# Patient Record
Sex: Female | Born: 1985 | Race: White | Hispanic: No | Smoking: Former smoker
Health system: Southern US, Community
[De-identification: ages and names within clinical notes are randomized; demographics above are authoritative.]

## PROBLEM LIST (undated history)

## (undated) DIAGNOSIS — M51369 Other intervertebral disc degeneration, lumbar region without mention of lumbar back pain or lower extremity pain: Secondary | ICD-10-CM

## (undated) DIAGNOSIS — K219 Gastro-esophageal reflux disease without esophagitis: Secondary | ICD-10-CM

## (undated) DIAGNOSIS — R011 Cardiac murmur, unspecified: Secondary | ICD-10-CM

## (undated) DIAGNOSIS — T8859XA Other complications of anesthesia, initial encounter: Secondary | ICD-10-CM

## (undated) DIAGNOSIS — C539 Malignant neoplasm of cervix uteri, unspecified: Secondary | ICD-10-CM

## (undated) DIAGNOSIS — R112 Nausea with vomiting, unspecified: Secondary | ICD-10-CM

## (undated) DIAGNOSIS — F32A Depression, unspecified: Secondary | ICD-10-CM

## (undated) DIAGNOSIS — M5030 Other cervical disc degeneration, unspecified cervical region: Secondary | ICD-10-CM

## (undated) DIAGNOSIS — Z9889 Other specified postprocedural states: Secondary | ICD-10-CM

## (undated) DIAGNOSIS — I341 Nonrheumatic mitral (valve) prolapse: Secondary | ICD-10-CM

## (undated) DIAGNOSIS — J42 Unspecified chronic bronchitis: Secondary | ICD-10-CM

## (undated) DIAGNOSIS — M4722 Other spondylosis with radiculopathy, cervical region: Secondary | ICD-10-CM | POA: Insufficient documentation

## (undated) DIAGNOSIS — M5136 Other intervertebral disc degeneration, lumbar region: Secondary | ICD-10-CM

## (undated) DIAGNOSIS — D649 Anemia, unspecified: Secondary | ICD-10-CM

## (undated) DIAGNOSIS — F419 Anxiety disorder, unspecified: Secondary | ICD-10-CM

## (undated) HISTORY — PX: CERVICAL BIOPSY: SHX590

## (undated) HISTORY — PX: OTHER SURGICAL HISTORY: SHX169

## (undated) HISTORY — PX: BACK SURGERY: SHX140

## (undated) HISTORY — PX: APPENDECTOMY: SHX54

---

## 2005-01-31 ENCOUNTER — Emergency Department: Payer: Self-pay | Admitting: Emergency Medicine

## 2007-11-17 DIAGNOSIS — I341 Nonrheumatic mitral (valve) prolapse: Secondary | ICD-10-CM | POA: Insufficient documentation

## 2008-08-05 ENCOUNTER — Ambulatory Visit: Payer: Self-pay | Admitting: Internal Medicine

## 2008-08-15 ENCOUNTER — Emergency Department: Payer: Self-pay | Admitting: Emergency Medicine

## 2012-01-17 ENCOUNTER — Emergency Department: Payer: Self-pay | Admitting: Emergency Medicine

## 2012-01-17 LAB — URINALYSIS, COMPLETE
Bacteria: NONE SEEN
Bilirubin,UR: NEGATIVE
Glucose,UR: NEGATIVE mg/dL (ref 0–75)
Ketone: NEGATIVE
Nitrite: NEGATIVE
Ph: 5 (ref 4.5–8.0)
Protein: NEGATIVE
RBC,UR: 4 /HPF (ref 0–5)
Specific Gravity: 1.021 (ref 1.003–1.030)
Squamous Epithelial: 4
WBC UR: 48 /HPF (ref 0–5)

## 2012-01-17 LAB — WET PREP, GENITAL

## 2012-01-17 LAB — PREGNANCY, URINE: Pregnancy Test, Urine: NEGATIVE m[IU]/mL

## 2012-02-12 ENCOUNTER — Emergency Department: Payer: Self-pay | Admitting: Emergency Medicine

## 2013-06-11 ENCOUNTER — Emergency Department: Payer: Self-pay | Admitting: Emergency Medicine

## 2013-06-11 LAB — COMPREHENSIVE METABOLIC PANEL
Albumin: 4.1 g/dL (ref 3.4–5.0)
Alkaline Phosphatase: 48 U/L — ABNORMAL LOW (ref 50–136)
Anion Gap: 1 — ABNORMAL LOW (ref 7–16)
BUN: 15 mg/dL (ref 7–18)
Bilirubin,Total: 0.7 mg/dL (ref 0.2–1.0)
Calcium, Total: 8.9 mg/dL (ref 8.5–10.1)
Chloride: 104 mmol/L (ref 98–107)
Co2: 32 mmol/L (ref 21–32)
Creatinine: 0.72 mg/dL (ref 0.60–1.30)
EGFR (African American): >60
EGFR (Non-African Amer.): >60
Glucose: 110 mg/dL — ABNORMAL HIGH (ref 65–99)
Osmolality: 275 (ref 275–301)
Potassium: 3.9 mmol/L (ref 3.5–5.1)
SGOT(AST): 22 U/L (ref 15–37)
SGPT (ALT): 18 U/L (ref 12–78)
Sodium: 137 mmol/L (ref 136–145)
Total Protein: 7.8 g/dL (ref 6.4–8.2)

## 2013-06-11 LAB — CBC
HCT: 37.1 % (ref 35.0–47.0)
HGB: 13 g/dL (ref 12.0–16.0)
MCH: 31.5 pg (ref 26.0–34.0)
MCHC: 35 g/dL (ref 32.0–36.0)
MCV: 90 fL (ref 80–100)
Platelet: 293 10*3/uL (ref 150–440)
RBC: 4.11 10*6/uL (ref 3.80–5.20)
RDW: 12.4 % (ref 11.5–14.5)
WBC: 6.6 10*3/uL (ref 3.6–11.0)

## 2013-06-11 LAB — TROPONIN I: Troponin-I: <0.02 ng/mL

## 2013-08-16 ENCOUNTER — Ambulatory Visit: Payer: Self-pay | Admitting: Internal Medicine

## 2013-11-11 HISTORY — PX: CERVICAL BIOPSY: SHX590

## 2013-12-12 DIAGNOSIS — C539 Malignant neoplasm of cervix uteri, unspecified: Secondary | ICD-10-CM | POA: Insufficient documentation

## 2014-02-04 DIAGNOSIS — R0609 Other forms of dyspnea: Secondary | ICD-10-CM | POA: Insufficient documentation

## 2014-02-04 DIAGNOSIS — R0789 Other chest pain: Secondary | ICD-10-CM | POA: Insufficient documentation

## 2014-02-04 DIAGNOSIS — F419 Anxiety disorder, unspecified: Secondary | ICD-10-CM | POA: Insufficient documentation

## 2014-02-04 DIAGNOSIS — F410 Panic disorder [episodic paroxysmal anxiety] without agoraphobia: Secondary | ICD-10-CM | POA: Insufficient documentation

## 2018-11-11 HISTORY — PX: ANTERIOR CERVICAL DECOMP/DISCECTOMY FUSION: SHX1161

## 2020-04-11 HISTORY — PX: OTHER SURGICAL HISTORY: SHX169

## 2020-07-23 DIAGNOSIS — F32A Depression, unspecified: Secondary | ICD-10-CM | POA: Insufficient documentation

## 2020-08-11 ENCOUNTER — Encounter: Payer: Self-pay | Admitting: Intensive Care

## 2020-08-11 ENCOUNTER — Emergency Department
Admission: EM | Admit: 2020-08-11 | Discharge: 2020-08-11 | Disposition: A | Discharge status: Home / Self Care | Attending: Emergency Medicine | Admitting: Emergency Medicine

## 2020-08-11 ENCOUNTER — Emergency Department

## 2020-08-11 ENCOUNTER — Other Ambulatory Visit: Payer: Self-pay

## 2020-08-11 DIAGNOSIS — Z87891 Personal history of nicotine dependence: Secondary | ICD-10-CM | POA: Diagnosis not present

## 2020-08-11 DIAGNOSIS — R1013 Epigastric pain: Secondary | ICD-10-CM | POA: Insufficient documentation

## 2020-08-11 HISTORY — DX: Other cervical disc degeneration, unspecified cervical region: M50.30

## 2020-08-11 HISTORY — DX: Depression, unspecified: F32.A

## 2020-08-11 HISTORY — DX: Nonrheumatic mitral (valve) prolapse: I34.1

## 2020-08-11 HISTORY — DX: Anxiety disorder, unspecified: F41.9

## 2020-08-11 HISTORY — DX: Other intervertebral disc degeneration, lumbar region: M51.36

## 2020-08-11 HISTORY — DX: Other intervertebral disc degeneration, lumbar region without mention of lumbar back pain or lower extremity pain: M51.369

## 2020-08-11 LAB — CBC
HCT: 38.4 % (ref 36.0–46.0)
Hemoglobin: 12.1 g/dL (ref 12.0–15.0)
MCH: 27 pg (ref 26.0–34.0)
MCHC: 31.5 g/dL (ref 30.0–36.0)
MCV: 85.7 fL (ref 80.0–100.0)
Platelets: 341 10*3/uL (ref 150–400)
RBC: 4.48 MIL/uL (ref 3.87–5.11)
RDW: 15 % (ref 11.5–15.5)
WBC: 5.7 10*3/uL (ref 4.0–10.5)
nRBC: 0 % (ref 0.0–0.2)

## 2020-08-11 LAB — COMPREHENSIVE METABOLIC PANEL
ALT: 18 U/L (ref 0–44)
AST: 22 U/L (ref 15–41)
Albumin: 4.3 g/dL (ref 3.5–5.0)
Alkaline Phosphatase: 55 U/L (ref 38–126)
Anion gap: 8 (ref 5–15)
BUN: 7 mg/dL (ref 6–20)
CO2: 25 mmol/L (ref 22–32)
Calcium: 9.2 mg/dL (ref 8.9–10.3)
Chloride: 103 mmol/L (ref 98–111)
Creatinine, Ser: 0.84 mg/dL (ref 0.44–1.00)
GFR calc Af Amer: >60 mL/min (ref >60)
GFR calc non Af Amer: >60 mL/min (ref >60)
Glucose, Bld: 96 mg/dL (ref 70–99)
Potassium: 3.8 mmol/L (ref 3.5–5.1)
Sodium: 136 mmol/L (ref 135–145)
Total Bilirubin: 0.9 mg/dL (ref 0.3–1.2)
Total Protein: 7.8 g/dL (ref 6.5–8.1)

## 2020-08-11 LAB — URINALYSIS, COMPLETE (UACMP) WITH MICROSCOPIC
Bilirubin Urine: NEGATIVE
Glucose, UA: NEGATIVE mg/dL
Hgb urine dipstick: NEGATIVE
Ketones, ur: NEGATIVE mg/dL
Leukocytes,Ua: NEGATIVE
Nitrite: NEGATIVE
Protein, ur: NEGATIVE mg/dL
Specific Gravity, Urine: 1.012 (ref 1.005–1.030)
pH: 5 (ref 5.0–8.0)

## 2020-08-11 LAB — POCT PREGNANCY, URINE: Preg Test, Ur: NEGATIVE

## 2020-08-11 LAB — LIPASE, BLOOD: Lipase: 30 U/L (ref 11–51)

## 2020-08-11 MED ORDER — TRAMADOL HCL 50 MG PO TABS
50.0000 mg | ORAL_TABLET | Freq: Four times a day (QID) | ORAL | 0 refills | Status: AC | PRN
Start: 2020-08-11 — End: 2020-08-14

## 2020-08-11 MED ORDER — IOHEXOL 300 MG/ML  SOLN
100.0000 mL | Freq: Once | INTRAMUSCULAR | Status: AC | PRN
  Administered 2020-08-11: 100 mL via INTRAVENOUS
  Filled 2020-08-11: qty 100

## 2020-08-11 MED ORDER — TRAMADOL HCL 50 MG PO TABS
50.0000 mg | ORAL_TABLET | Freq: Four times a day (QID) | ORAL | 0 refills | Status: DC | PRN
Start: 2020-08-11 — End: 2020-08-11

## 2020-08-11 NOTE — ED Notes (Signed)
See triage note  Presents with some abd pain  States she had a tummy tuck couple of months ago  No fever  States pain is worsen

## 2020-08-11 NOTE — ED Triage Notes (Signed)
Patient presents with abdominal pain that has worsened today. June 30,2021 patient had a muscle repair and tummy tuck completed in [place]. Has since relocated here and her surgeon sent her to ER.

## 2020-08-11 NOTE — ED Provider Notes (Signed)
Emergency Department Provider Note  ____________________________________________  Time seen: Approximately 7:43 PM  I have reviewed the triage vital signs and the nursing notes.   HISTORY  Chief Complaint No chief complaint on file.   Historian Patient     HPI Amanda Harrington is a 34 y.o. female presents to the emergency department with epigastric abdominal pain.  Patient states that she had a tummy tuck several months ago in [place].  She states that she had some lower abdominal discomfort since coming to have occurred but lower abdominal pain is since improved.  Patient states over the past week, she has had worsening epigastric pain that does not change with eating or drinking.  She states that pain is relatively constant.  She states that she has had 1 day of tactile fever earlier this week.  No nausea or vomiting.  She denies chest pain, chest tightness or shortness of breath.  No other alleviating measures have been attempted.   Past Medical History:  Diagnosis Date  . Anxiety   . Degenerative disc disease, cervical   . Degenerative disc disease, lumbar   . Depression   . Mitral valve prolapse       Immunizations up to date:  Yes.     Past Medical History:  Diagnosis Date  . Anxiety   . Degenerative disc disease, cervical   . Degenerative disc disease, lumbar   . Depression   . Mitral valve prolapse     There are no problems to display for this patient.   History reviewed. No pertinent surgical history.  Prior to Admission medications   Medication Sig Start Date End Date Taking? Authorizing Provider  clonazePAM (KLONOPIN) 1 MG tablet Take 1 mg by mouth 2 (two) times daily as needed for anxiety.   Yes [provider]  metoprolol tartrate (LOPRESSOR) 50 MG tablet Take 50 mg by mouth 2 (two) times daily.   Yes [provider]  omeprazole (PRILOSEC) 40 MG capsule Take 40 mg by mouth daily.   Yes [provider]   venlafaxine (EFFEXOR) 100 MG tablet Take 200 mg by mouth daily.   Yes [provider]  traMADol (ULTRAM) 50 MG tablet Take 1 tablet (50 mg total) by mouth every 6 (six) hours as needed for up to 3 days. 08/11/20 08/14/20  [name], PA-C    Allergies Phenergan [promethazine hcl]  History reviewed. No pertinent family history.  Social History Social History   Tobacco Use  . Smoking status: Former Smoker    Types: Cigarettes  . Smokeless tobacco: Never Used  Vaping Use  . Vaping Use: Every day  Substance Use Topics  . Alcohol use: Yes    Comment: occ  . Drug use: Not Currently    Types: Marijuana     Review of Systems  Constitutional: No fever/chills Eyes:  No discharge ENT: No upper respiratory complaints. Respiratory: no cough. No SOB/ use of accessory muscles to breath Gastrointestinal: Patient has epigastric abdominal discomfort.  Musculoskeletal: Negative for musculoskeletal pain. Skin: Negative for rash, abrasions, lacerations, ecchymosis.    _________________________________________   PHYSICAL EXAM:  VITAL SIGNS: ED Triage Vitals  Enc Vitals Group     BP 08/11/20 1516 (!) 128/95     Pulse Rate 08/11/20 1516 80     Resp 08/11/20 1516 16     Temp 08/11/20 1516 98.8 F (37.1 C)     Temp Source 08/11/20 1516 Oral     SpO2 08/11/20 1516 99 %  Weight 08/11/20 1517 142 lb (64.4 kg)     Height 08/11/20 1517 5\' 2"  (1.575 m)     Head Circumference --      Peak Flow --      Pain Score 08/11/20 1516 8     Pain Loc --      Pain Edu? --      Excl. in [place]? --      Constitutional: Alert and oriented. Well appearing and in no acute distress. Eyes: Conjunctivae are normal. PERRL. EOMI. Head: Atraumatic. ENT:      Nose: No congestion/rhinnorhea.      Mouth/Throat: Mucous membranes are moist.  Neck: No stridor.  No cervical spine tenderness to palpation. Cardiovascular: Normal rate, regular rhythm. Normal S1 and S2.  Good peripheral  circulation. Respiratory: Normal respiratory effort without tachypnea or retractions. Lungs CTAB. Good air entry to the bases with no decreased or absent breath sounds Gastrointestinal: Tummy tuck scars visualized.  Bowel sounds x 4 quadrants. Soft and nontender to palpation. No guarding or rigidity. No distention. Musculoskeletal: Full range of motion to all extremities. No obvious deformities noted Neurologic:  Normal for age. No gross focal neurologic deficits are appreciated.  Skin:  Skin is warm, dry and intact. No rash noted. Psychiatric: Mood and affect are normal for age. Speech and behavior are normal.   ____________________________________________   LABS (all labs ordered are listed, but only abnormal results are displayed)  Labs Reviewed  URINALYSIS, COMPLETE (UACMP) WITH MICROSCOPIC - Abnormal; Notable for the following components:      Result Value   Color, Urine YELLOW (*)    APPearance HAZY (*)    Bacteria, UA MANY (*)    All other components within normal limits  LIPASE, BLOOD  COMPREHENSIVE METABOLIC PANEL  CBC  POCT PREGNANCY, URINE  POC URINE PREG, ED   ____________________________________________  EKG   ____________________________________________  RADIOLOGY Unk Pinto, personally viewed and evaluated these images (plain radiographs) as part of my medical decision making, as well as reviewing the written report by the radiologist.    CT ABDOMEN PELVIS W CONTRAST  Result Date: 08/11/2020 CLINICAL DATA:  Abdominal pain and fever. Patient reports muscle repair and tummy tuck May 10, 2020 at an outside institution. EXAM: CT ABDOMEN AND PELVIS WITH CONTRAST TECHNIQUE: Multidetector CT imaging of the abdomen and pelvis was performed using the standard protocol following bolus administration of intravenous contrast. CONTRAST:  122mL OMNIPAQUE IOHEXOL 300 MG/ML  SOLN COMPARISON:  None. FINDINGS: Lower chest: Lung bases are clear. Hepatobiliary: No focal  hepatic abnormality. Suspect intraluminal gallstones without abnormal gallbladder distention or pericholecystic inflammation. There is no biliary dilatation. Pancreas: No ductal dilatation or inflammation. Spleen: Normal in size without focal abnormality. Adrenals/Urinary Tract: Normal adrenal glands. No hydronephrosis or perinephric edema. Homogeneous renal enhancement. Urinary bladder is physiologically distended without wall thickening. Stomach/Bowel: Nondistended stomach. Normal positioning of the duodenum and ligament of Treitz. There is no bowel obstruction or inflammation. Lipomatous hypertrophy of the ileocecal valve. Appendix is not seen, there are surgical clips adjacent to the cecum suggesting prior appendectomy per liquid stool in the proximal most colon. Formed stool in the transverse colon. Occasional diverticula involving the descending colon. No colonic inflammation or pericolonic edema. Vascular/Lymphatic: Normal caliber abdominal aorta. Patent portal vein. No enlarged abdominopelvic lymph nodes. Few prominent bilateral inguinal nodes are likely reactive. Reproductive: Anteverted uterus which is unremarkable. Symmetric appearing ovaries with uniformly sized follicles. No suspicious adnexal mass. Trace free fluid in the pelvis. Other:  Trace free fluid in the pelvis, no other free fluid or ascites. There is no free air. Postsurgical change of the lower anterior abdominal wall with stranding. No discrete focal fluid collection. No soft tissue air. Postsurgical change at the umbilicus. Musculoskeletal: There are no acute or suspicious osseous abnormalities. No intramuscular fluid collection. IMPRESSION: 1. Postsurgical change of the lower anterior abdominal wall with stranding. No discrete focal fluid collection or soft tissue air. This may represent postsurgical scarring or infection. 2. Suspect intraluminal gallstones without gallbladder inflammation. 3. Minimal colonic diverticulosis without  diverticulitis. Electronically Signed   By: Keith Rake M.D.   On: 08/11/2020 20:21    ____________________________________________    PROCEDURES  Procedure(s) performed:     Procedures     Medications  iohexol (OMNIPAQUE) 300 MG/ML solution 100 mL (100 mLs Intravenous Contrast Given 08/11/20 2004)     ____________________________________________   INITIAL IMPRESSION / ASSESSMENT AND PLAN / ED COURSE  Pertinent labs & imaging results that were available during my care of the patient were reviewed by me and considered in my medical decision making (see chart for details).  Clinical Course as of Aug 11 2110  Fri Aug 11, 2020  1700 Albumin: 4.3 [JW]    Clinical Course User Index [JW] [name], PA-C     Assessment and Plan:  Abdominal Pain:  34 year old female presents to the emergency department with epigastric abdominal pain that has worsened in intensity over the past 2 to 3 days.  Vital signs are reassuring at triage.  On physical exam, patient had some epigastric tenderness to palpation but no guarding.  CT abdomen and pelvis were obtained as patient has had recent tummy tuck procedure.  CBC and CMP were reassuring.  Urine pregnancy test was negative.  Lipase was within reference range.  CT abdomen and pelvis revealed some lower abdominal stranding.  Patient did have some gallstones identified on CT.  I consulted Dr. Lysle Pearl given patient's history of tummy tuck procedure in June of this year.  He did not feel like epigastric abdominal pain was associated with lower abdominal straining identified on CT.  He recommended outpatient follow-up with him in the office as he suspected that patient might need a nonemergent right upper quadrant ultrasound.  Patient was discharged with a short course of tramadol for pain.  Patient questions were answered.    ____________________________________________  FINAL CLINICAL IMPRESSION(S) / ED DIAGNOSES  Final  diagnoses:  Epigastric abdominal pain      NEW MEDICATIONS STARTED DURING THIS VISIT:  ED Discharge Orders         Ordered    traMADol (ULTRAM) 50 MG tablet  Every 6 hours PRN,   Status:  Discontinued        08/11/20 2102    traMADol (ULTRAM) 50 MG tablet  Every 6 hours PRN,   Status:  Discontinued        08/11/20 2103    traMADol (ULTRAM) 50 MG tablet  Every 6 hours PRN        08/11/20 2104              This chart was dictated using voice recognition software/Dragon. Despite best efforts to proofread, errors can occur which can change the meaning. Any change was purely unintentional.     [name], PA-C 08/11/20 2127    Lucrezia Starch, MD 08/11/20 2230

## 2020-08-11 NOTE — ED Notes (Signed)
Pt ambulatory to room. In NAD currently; skin dry; pt calm. Provider to bedside.

## 2020-08-11 NOTE — Discharge Instructions (Signed)
Take Tramadol for pain.

## 2020-08-14 ENCOUNTER — Other Ambulatory Visit: Payer: Self-pay

## 2020-08-14 ENCOUNTER — Emergency Department
Admission: EM | Admit: 2020-08-14 | Discharge: 2020-08-14 | Disposition: A | Discharge status: Home / Self Care | Attending: Emergency Medicine | Admitting: Emergency Medicine

## 2020-08-14 ENCOUNTER — Encounter: Payer: Self-pay | Admitting: *Deleted

## 2020-08-14 DIAGNOSIS — Z79899 Other long term (current) drug therapy: Secondary | ICD-10-CM | POA: Insufficient documentation

## 2020-08-14 DIAGNOSIS — Z87891 Personal history of nicotine dependence: Secondary | ICD-10-CM | POA: Diagnosis not present

## 2020-08-14 DIAGNOSIS — N390 Urinary tract infection, site not specified: Secondary | ICD-10-CM | POA: Diagnosis not present

## 2020-08-14 DIAGNOSIS — R300 Dysuria: Secondary | ICD-10-CM | POA: Diagnosis present

## 2020-08-14 LAB — CBC
HCT: 39 % (ref 36.0–46.0)
Hemoglobin: 12.4 g/dL (ref 12.0–15.0)
MCH: 27.3 pg (ref 26.0–34.0)
MCHC: 31.8 g/dL (ref 30.0–36.0)
MCV: 85.7 fL (ref 80.0–100.0)
Platelets: 407 10*3/uL — ABNORMAL HIGH (ref 150–400)
RBC: 4.55 MIL/uL (ref 3.87–5.11)
RDW: 15.3 % (ref 11.5–15.5)
WBC: 7.6 10*3/uL (ref 4.0–10.5)
nRBC: 0 % (ref 0.0–0.2)

## 2020-08-14 LAB — COMPREHENSIVE METABOLIC PANEL
ALT: 17 U/L (ref 0–44)
AST: 22 U/L (ref 15–41)
Albumin: 4.5 g/dL (ref 3.5–5.0)
Alkaline Phosphatase: 66 U/L (ref 38–126)
Anion gap: 13 (ref 5–15)
BUN: 8 mg/dL (ref 6–20)
CO2: 24 mmol/L (ref 22–32)
Calcium: 9.1 mg/dL (ref 8.9–10.3)
Chloride: 102 mmol/L (ref 98–111)
Creatinine, Ser: 0.67 mg/dL (ref 0.44–1.00)
GFR calc Af Amer: >60 mL/min (ref >60)
GFR calc non Af Amer: >60 mL/min (ref >60)
Glucose, Bld: 94 mg/dL (ref 70–99)
Potassium: 3.8 mmol/L (ref 3.5–5.1)
Sodium: 139 mmol/L (ref 135–145)
Total Bilirubin: 1 mg/dL (ref 0.3–1.2)
Total Protein: 8 g/dL (ref 6.5–8.1)

## 2020-08-14 LAB — URINALYSIS, COMPLETE (UACMP) WITH MICROSCOPIC
Bilirubin Urine: NEGATIVE
Glucose, UA: NEGATIVE mg/dL
Hgb urine dipstick: NEGATIVE
Ketones, ur: 5 mg/dL — AB
Leukocytes,Ua: NEGATIVE
Nitrite: NEGATIVE
Protein, ur: 30 mg/dL — AB
Specific Gravity, Urine: 1.024 (ref 1.005–1.030)
pH: 5 (ref 5.0–8.0)

## 2020-08-14 LAB — POCT PREGNANCY, URINE: Preg Test, Ur: NEGATIVE

## 2020-08-14 LAB — LIPASE, BLOOD: Lipase: 29 U/L (ref 11–51)

## 2020-08-14 MED ORDER — CEPHALEXIN 500 MG PO CAPS
500.0000 mg | ORAL_CAPSULE | Freq: Once | ORAL | Status: AC
  Administered 2020-08-14: 500 mg via ORAL
  Filled 2020-08-14: qty 1

## 2020-08-14 MED ORDER — CEPHALEXIN 500 MG PO CAPS: 500.0000 mg | ORAL_CAPSULE | Freq: Two times a day (BID) | ORAL | 0 refills | Status: DC

## 2020-08-14 NOTE — ED Triage Notes (Signed)
Pt reports abd pain and low back pain. Pt has urinary frequency and pressure.  No v/d.  Pt has nausea.  Pt alert.  Pt was here 3 days ago with similar sx.

## 2020-08-14 NOTE — ED Notes (Signed)
Pt alert and oriented X 4, stable for discharge. RR even and unlabored, color WNL. Discussed discharge instructions and follow-up as directed. Discharge medications discussed if provided. Pt had opportunity to ask questions if necessary and RN to provide patient/family eduction.  

## 2020-08-14 NOTE — ED Provider Notes (Signed)
[facility] Emergency Department Provider Note   ____________________________________________    I have reviewed the triage vital signs and the nursing notes.   HISTORY  Chief Complaint Abdominal Pain and Back Pain     HPI Amanda Harrington is a 34 y.o. female who presents with complaints of dysuria, urinary frequency intermittent flank discomfort bilaterally.  No fevers or chills.  Also reports some epigastric discomfort which she has had intermittently as well, she is concerned that it could be related to gallbladder seen on recent CT.  No abdominal pain or tenderness in the epigastrium at this time.  Past Medical History:  Diagnosis Date  . Anxiety   . Degenerative disc disease, cervical   . Degenerative disc disease, lumbar   . Depression   . Mitral valve prolapse     There are no problems to display for this patient.   No past surgical history on file.  Prior to Admission medications   Medication Sig Start Date End Date Taking? Authorizing Provider  cephALEXin (KEFLEX) 500 MG capsule Take 1 capsule (500 mg total) by mouth 2 (two) times daily. 08/14/20   Lavonia Drafts, MD  clonazePAM (KLONOPIN) 1 MG tablet Take 1 mg by mouth 2 (two) times daily as needed for anxiety.    [provider]  metoprolol tartrate (LOPRESSOR) 50 MG tablet Take 50 mg by mouth 2 (two) times daily.    [provider]  omeprazole (PRILOSEC) 40 MG capsule Take 40 mg by mouth daily.    [provider]  traMADol (ULTRAM) 50 MG tablet Take 1 tablet (50 mg total) by mouth every 6 (six) hours as needed for up to 3 days. 08/11/20 08/14/20  [name], PA-C  venlafaxine (EFFEXOR) 100 MG tablet Take 200 mg by mouth daily.    [provider]     Allergies Phenergan [promethazine hcl]  No family history on file.  Social History Social History   Tobacco Use  . Smoking status: Former Smoker    Types: Cigarettes  . Smokeless tobacco:  Never Used  Vaping Use  . Vaping Use: Every day  Substance Use Topics  . Alcohol use: Yes    Comment: occ  . Drug use: Not Currently    Types: Marijuana    Review of Systems  Constitutional: No fever/chills Eyes: No visual changes.  ENT: No sore throat. Cardiovascular: No chest Respiratory: No shortness of breath Gastrointestinal: As above Genitourinary: As above Musculoskeletal: Negative for back pain. Skin: Negative for rash. Neurological: Negative for headaches or weakness   ____________________________________________   PHYSICAL EXAM:  VITAL SIGNS: ED Triage Vitals  Enc Vitals Group     BP 08/14/20 2000 119/82     Pulse Rate 08/14/20 2000 93     Resp 08/14/20 2000 18     Temp 08/14/20 2000 98.9 F (37.2 C)     Temp Source 08/14/20 2000 Oral     SpO2 08/14/20 2000 99 %     Weight 08/14/20 2002 64.4 kg (142 lb)     Height 08/14/20 2002 1.575 m (5\' 2" )     Head Circumference --      Peak Flow --      Pain Score 08/14/20 2001 8     Pain Loc --      Pain Edu? --      Excl. in [place]? --     Constitutional: Alert and oriented. No acute distress. Pleasant and interactive  Nose: No congestion/rhinnorhea.  Mouth/Throat: Mucous membranes are moist.    Cardiovascular: Normal rate, regular rhythm.  Good peripheral circulation. Respiratory: Normal respiratory effort.  No retractions.  Gastrointestinal: Soft and nontender. No distention.  No CVA tenderness.  Musculoskeletal: No lower extremity tenderness nor edema.  Warm and well perfused Neurologic:  Normal speech and language. No gross focal neurologic deficits are appreciated.  Skin:  Skin is warm, dry and intact. No rash noted. Psychiatric: Mood and affect are normal. Speech and behavior are normal.  ____________________________________________   LABS (all labs ordered are listed, but only abnormal results are displayed)  Labs Reviewed  CBC - Abnormal; Notable for the following components:      Result Value    Platelets 407 (*)    All other components within normal limits  URINALYSIS, COMPLETE (UACMP) WITH MICROSCOPIC - Abnormal; Notable for the following components:   Color, Urine AMBER (*)    APPearance CLOUDY (*)    Ketones, ur 5 (*)    Protein, ur 30 (*)    Bacteria, UA MANY (*)    All other components within normal limits  LIPASE, BLOOD  COMPREHENSIVE METABOLIC PANEL  POC URINE PREG, ED  POCT PREGNANCY, URINE   ____________________________________________  EKG   ____________________________________________  RADIOLOGY   ____________________________________________   PROCEDURES  Procedure(s) performed: No  Procedures   Critical Care performed: No ____________________________________________   INITIAL IMPRESSION / ASSESSMENT AND PLAN / ED COURSE  Pertinent labs & imaging results that were available during my care of the patient were reviewed by me and considered in my medical decision making (see chart for details).  Patient well-appearing and in no acute distress.  Given dysuria, urinary frequency highly suspicious for urinary tract infection, urinalysis consistent with UTI, treated with Keflex here in the emergency department, prescription provided as well.  Lab work is quite reassuring, white blood cell count is normal.  Vitals are normal.  Outpatient follow-up as needed.    ____________________________________________   FINAL CLINICAL IMPRESSION(S) / ED DIAGNOSES  Final diagnoses:  Lower urinary tract infectious disease        Note:  This document was prepared using Dragon voice recognition software and may include unintentional dictation errors.   Lavonia Drafts, MD 08/14/20 2113

## 2020-08-22 ENCOUNTER — Ambulatory Visit: Payer: Self-pay | Admitting: Surgery

## 2020-08-22 NOTE — H&P (Signed)
Subjective:   CC: Biliary colic [K09.38]  HPI:  Amanda Harrington is a 34 y.o. female who was referred by Emergency Room for evaluation of above CC. Symptoms were first noted several years ago. Pain is achy and intermittent, radiating from the epigastric area, to the upper back.  Associated with constant nausea, exacerbated by nothing specific.  Happens anytime of day.  PPI has not helped.  Mult ED visits with no definitive dx.  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  Past Surgical History: abdominoplasty, lap appy  Family History: family history includes Anxiety in her father; Colon cancer in her father; Heart disease in her father and mother.  Social History:  reports that she has been smoking cigarettes. She has never used smokeless tobacco. She reports current alcohol use of about 3.0 standard drinks of alcohol per week. No history on file for drug use.  Current Medications: has a current medication list which includes the following prescription(s): albuterol, cephalexin, clonazepam, fluticasone propionate, gabapentin, metoprolol succinate, omeprazole, promethazine, venlafaxine, and venlafaxine.  Allergies:  Allergies as of 08/21/2020  . (Not on File)    ROS:  A 15 point review of systems was performed and pertinent positives and negatives noted in HPI    Objective:     BP 118/83   Pulse (!) 112   Ht 157.5 cm (5\' 2" )   Wt 64.4 kg (142 lb)   BMI 25.97 kg/m    Constitutional :  alert, appears stated age, cooperative and no distress  Lymphatics/Throat:  no asymmetry, masses, or scars  Respiratory:  clear to auscultation bilaterally  Cardiovascular:  regular rate and rhythm  Gastrointestinal: soft, no guarding, mild TTP epigastric/RUQ.    Musculoskeletal: Steady  gait and movement  Skin: Cool and moist  Psychiatric: Normal affect, non-agitated, not confused       LABS:  n/a   RADS: CLINICAL DATA: Abdominal pain and fever. Patient reports muscle  repair and tummy tuck May 10, 2020 at an outside institution.   EXAM:  CT ABDOMEN AND PELVIS WITH CONTRAST   TECHNIQUE:  Multidetector CT imaging of the abdomen and pelvis was performed  using the standard protocol following bolus administration of  intravenous contrast.   CONTRAST: 171mL OMNIPAQUE IOHEXOL 300 MG/ML SOLN   COMPARISON: None.   FINDINGS:  Lower chest: Lung bases are clear.   Hepatobiliary: No focal hepatic abnormality. Suspect intraluminal  gallstones without abnormal gallbladder distention or  pericholecystic inflammation. There is no biliary dilatation.   Pancreas: No ductal dilatation or inflammation.   Spleen: Normal in size without focal abnormality.   Adrenals/Urinary Tract: Normal adrenal glands. No hydronephrosis or  perinephric edema. Homogeneous renal enhancement. Urinary bladder is  physiologically distended without wall thickening.   Stomach/Bowel: Nondistended stomach. Normal positioning of the  duodenum and ligament of Treitz. There is no bowel obstruction or  inflammation. Lipomatous hypertrophy of the ileocecal valve.  Appendix is not seen, there are surgical clips adjacent to the cecum  suggesting prior appendectomy per liquid stool in the proximal most  colon. Formed stool in the transverse colon. Occasional diverticula  involving the descending colon. No colonic inflammation or  pericolonic edema.   Vascular/Lymphatic: Normal caliber abdominal aorta. Patent portal  vein. No enlarged abdominopelvic lymph nodes. Few prominent  bilateral inguinal nodes are likely  reactive.   Reproductive: Anteverted uterus which is unremarkable. Symmetric  appearing ovaries with uniformly sized follicles. No suspicious  adnexal mass. Trace free fluid in the  pelvis.   Other: Trace free fluid in the pelvis, no other free fluid or  ascites. There is no free air. Postsurgical change of the lower  anterior abdominal wall with stranding. No discrete focal fluid  collection. No soft tissue air. Postsurgical change at the  umbilicus.   Musculoskeletal: There are no acute or suspicious osseous  abnormalities. No intramuscular fluid collection.   IMPRESSION:  1. Postsurgical change of the lower anterior abdominal wall with  stranding. No discrete focal fluid collection or soft tissue air.  This may represent postsurgical scarring or infection.  2. Suspect intraluminal gallstones without gallbladder inflammation.  3. Minimal colonic diverticulosis without diverticulitis.    Electronically Signed  By: Keith Rake M.D.  On: 08/11/2020 20:21  Assessment:      Biliary colic [Y17.49], HIDA vs proceeding with surgery, due to lack of objective findings of acute chole.  Pt requested to have something done to relieve pain, so will proceed with surgery.  Plan:     1. Biliary colic [S49.67] Discussed the risk of surgery including post-op infxn, seroma, biloma, chronic pain, poor-delayed wound healing, retained gallstone, conversion to open procedure, post-op SBO or ileus, and need for additional procedures to address said risks.  The risks of general anesthetic including MI, CVA, sudden death or even reaction to anesthetic medications also discussed. Alternatives include continued observation.  Benefits include possible symptom relief, prevention of complications including acute cholecystitis, pancreatitis.  Typical post operative recovery of 3-5 days rest, continued pain in area and incision sites, possible loose stools up to 4-6 weeks, also discussed.  ED return precautions given for sudden increase in RUQ pain, with possible accompanying fever, nausea, and/or vomiting.  The patient understands the risks, any and all questions were answered  to the patient's satisfaction.  2. Patient has elected to proceed with surgical treatment. Procedure will be scheduled.  Written consent was obtained .robotic assisted laparoscopic

## 2020-08-28 ENCOUNTER — Encounter
Admission: RE | Admit: 2020-08-28 | Discharge: 2020-08-28 | Disposition: A | Discharge status: Home / Self Care | Source: Ambulatory Visit | Attending: Surgery | Admitting: Surgery

## 2020-08-28 DIAGNOSIS — Z01818 Encounter for other preprocedural examination: Secondary | ICD-10-CM | POA: Insufficient documentation

## 2020-08-28 HISTORY — DX: Anemia, unspecified: D64.9

## 2020-08-28 HISTORY — DX: Other complications of anesthesia, initial encounter: T88.59XA

## 2020-08-28 HISTORY — DX: Gastro-esophageal reflux disease without esophagitis: K21.9

## 2020-08-28 HISTORY — DX: Nausea with vomiting, unspecified: R11.2

## 2020-08-28 HISTORY — DX: Cardiac murmur, unspecified: R01.1

## 2020-08-28 HISTORY — DX: Other specified postprocedural states: Z98.890

## 2020-08-28 NOTE — Patient Instructions (Addendum)
Your procedure is scheduled on: 08/31/20- Thursday Report to Day Surgery on the 2nd floor of the [facility]. To find out your arrival time, please call [phone] between 1PM - 3PM on: 08/30/20- Wednesday  REMEMBER: Instructions that are not followed completely may result in serious medical risk, up to and including death; or upon the discretion of your surgeon and anesthesiologist your surgery may need to be rescheduled.  Do not eat food after midnight the night before surgery.  No gum chewing, lozengers or hard candies.  You may however, drink CLEAR liquids up to 2 hours before you are scheduled to arrive for your surgery. Do not drink anything within 2 hours of your scheduled arrival time.  Clear liquids include: - water  - apple juice without pulp - gatorade (not RED, PURPLE, OR BLUE) - black coffee or tea (Do NOT add milk or creamers to the coffee or tea) Do NOT drink anything that is not on this list.   TAKE THESE MEDICATIONS THE MORNING OF SURGERY WITH A SIP OF WATER:  1. metoprolol succinate (TOPROL-XL) 50 MG 24 hr tablet 2. omeprazole (PRILOSEC) 40 MG capsule, (take one the night before and one on the morning of surgery - helps to prevent nausea after surgery.) 3.venlafaxine XR (EFFEXOR-XR) 150 MG 24 hr capsule  4. venlafaxine XR (EFFEXOR-XR) 75 MG 24 hr capsule    Use albuterol (VENTOLIN HFA) 108 (90 Base) MCG/ACT inhaler on the day of surgery and bring to the hospital.  One week prior to surgery: Stop Anti-inflammatories (NSAIDS) such as Advil, Aleve, Ibuprofen, Motrin, Naproxen, Naprosyn and Aspirin based products such as Excedrin, Goodys Powder, BC Powder . Stop ANY OVER THE COUNTER supplements until after surgery. (You may continue taking Tylenol, Vitamin D, Vitamin B, and multivitamin.)  No Alcohol for 24 hours before or after surgery.  No Smoking including e-cigarettes for 24 hours prior to surgery.  No chewable tobacco products for at least 6 hours prior  to surgery.  No nicotine patches on the day of surgery.  Do not use any "recreational" drugs for at least a week prior to your surgery.  Please be advised that the combination of cocaine and anesthesia may have negative outcomes, up to and including death. If you test positive for cocaine, your surgery will be cancelled.  On the morning of surgery brush your teeth with toothpaste and water, you may rinse your mouth with mouthwash if you wish. Do not swallow any toothpaste or mouthwash.  Do not wear jewelry, make-up, hairpins, clips or nail polish.  Do not wear lotions, powders, or perfumes.   Do not shave 48 hours prior to surgery.   Contact lenses, hearing aids and dentures may not be worn into surgery.  Do not bring valuables to the hospital. [facility] is not responsible for any missing/lost belongings or valuables.   Use CHG Soap or wipes as directed on instruction sheet.  Notify your doctor if there is any change in your medical condition (cold, fever, infection).  Wear comfortable clothing (specific to your surgery type) to the hospital.  Plan for stool softeners for home use; pain medications have a tendency to cause constipation. You can also help prevent constipation by eating foods high in fiber such as fruits and vegetables and drinking plenty of fluids as your diet allows.  After surgery, you can help prevent lung complications by doing breathing exercises.  Take deep breaths and cough every 1-2 hours. Your doctor may order a device called an  Incentive Spirometer to help you take deep breaths. When coughing or sneezing, hold a pillow firmly against your incision with both hands. This is called "splinting." Doing this helps protect your incision. It also decreases belly discomfort.  If you are being admitted to the hospital overnight, leave your suitcase in the car. After surgery it may be brought to your room.  If you are being discharged the day of surgery, you will  not be allowed to drive home. You will need a responsible adult (18 years or older) to drive you home and stay with you that night.   If you are taking public transportation, you will need to have a responsible adult (18 years or older) with you. Please confirm with your physician that it is acceptable to use public transportation.   Please call the [facility] at [phone] if you have any questions about these instructions.  Visitation Policy:  Patients undergoing a surgery or procedure may have one family member or support person with them as long as that person is not COVID-19 positive or experiencing its symptoms.  That person may remain in the waiting area during the procedure.  Inpatient Visitation Update:   In an effort to ensure the safety of our team members and our patients, we are implementing a change to our visitation policy:  Effective Monday, Aug. 9, at 7 a.m., inpatients will be allowed one support person.  o The support person may change daily.  o The support person must pass our screening, gel in and out, and wear a mask at all times, including in the patient's room.  o Patients must also wear a mask when staff or their support person are in the room.  o Masking is required regardless of vaccination status.  Systemwide, no visitors 17 or younger.

## 2020-08-29 ENCOUNTER — Other Ambulatory Visit
Admission: RE | Admit: 2020-08-29 | Discharge: 2020-08-29 | Disposition: A | Discharge status: Home / Self Care | Source: Ambulatory Visit | Attending: Surgery | Admitting: Surgery

## 2020-08-29 NOTE — Progress Notes (Signed)
No show for covid test. Call to patient, left message on voicemail

## 2020-08-29 NOTE — Progress Notes (Signed)
Patient returned call and voiced that she will be coming in to do her EKG and Covid test tomorrow, 08/30/20.

## 2020-08-30 ENCOUNTER — Encounter
Admission: RE | Admit: 2020-08-30 | Discharge: 2020-08-30 | Disposition: A | Discharge status: Home / Self Care | Source: Ambulatory Visit | Attending: Surgery | Admitting: Surgery

## 2020-08-30 ENCOUNTER — Encounter: Payer: Self-pay | Admitting: Urgent Care

## 2020-08-30 NOTE — Pre-Procedure Instructions (Signed)
Attempted to reach patient via phone but was unsuccessful to remind her that she needed to be at [facility] for covid testing and EKG prior to her surgery in the morning.  Will contact Dr. Lysle Pearl office to let them know.

## 2020-08-31 ENCOUNTER — Encounter: Admission: RE | Payer: Self-pay | Source: Home / Self Care

## 2020-08-31 ENCOUNTER — Ambulatory Visit: Admission: RE | Admit: 2020-08-31 | Source: Home / Self Care | Admitting: Surgery

## 2020-08-31 SURGERY — CHOLECYSTECTOMY, ROBOT-ASSISTED, LAPAROSCOPIC
Anesthesia: General | Site: Abdomen

## 2020-11-10 ENCOUNTER — Telehealth: Admitting: Family

## 2020-11-10 DIAGNOSIS — J029 Acute pharyngitis, unspecified: Secondary | ICD-10-CM | POA: Diagnosis not present

## 2020-11-10 MED ORDER — AMOXICILLIN 500 MG PO CAPS: 500.0000 mg | ORAL_CAPSULE | Freq: Three times a day (TID) | ORAL | 0 refills | Status: DC

## 2020-11-10 NOTE — Progress Notes (Signed)
  We are sorry that you are not feeling well.  Here is how we plan to help!  Based on what you have shared with me it is likely that you have strep pharyngitis.  Strep pharyngitis is inflammation and infection in the back of the throat.  This is an infection cause by bacteria and is treated with antibiotics.  I have prescribed Amoxicillin 500 mg twice a day for 10 days. For throat pain, we recommend over the counter oral pain relief medications such as acetaminophen or aspirin, or anti-inflammatory medications such as ibuprofen or naproxen sodium. Topical treatments such as oral throat lozenges or sprays may be used as needed. Strep infections are not as easily transmitted as other respiratory infections, however we still recommend that you avoid close contact with loved ones, especially the very young and elderly.  Remember to wash your hands thoroughly throughout the day as this is the number one way to prevent the spread of infection and wipe down door knobs and counters with disinfectant.   Home Care:  Only take medications as instructed by your medical team.  Complete the entire course of an antibiotic.  Do not take these medications with alcohol.  A steam or ultrasonic humidifier can help congestion.  You can place a towel over your head and breathe in the steam from hot water coming from a faucet.  Avoid close contacts especially the very young and the elderly.  Cover your mouth when you cough or sneeze.  Always remember to wash your hands.  Get Help Right Away If:  You develop worsening fever or sinus pain.  You develop a severe head ache or visual changes.  Your symptoms persist after you have completed your treatment plan.  Make sure you  Understand these instructions.  Will watch your condition.  Will get help right away if you are not doing well or get worse.  Your e-visit answers were reviewed by a board certified advanced clinical practitioner to complete your  personal care plan.  Depending on the condition, your plan could have included both over the counter or prescription medications.  If there is a problem please reply  once you have received a response from your provider.  Your safety is important to us.  If you have drug allergies check your prescription carefully.    You can use MyChart to ask questions about today's visit, request a non-urgent call back, or ask for a work or school excuse for 24 hours related to this e-Visit. If it has been greater than 24 hours you will need to follow up with your provider, or enter a new e-Visit to address those concerns.  You will get an e-mail in the next two days asking about your experience.  I hope that your e-visit has been valuable and will speed your recovery. Thank you for using e-visits.  Greater than 5 minutes, yet less than 10 minutes of time have been spent researching, coordinating, and implementing care for this patient today.  Thank you for the details you included in the comment boxes. Those details are very helpful in determining the best course of treatment for you and help us to provide the best care.  

## 2020-11-11 ENCOUNTER — Encounter: Payer: Self-pay | Admitting: Emergency Medicine

## 2020-11-11 ENCOUNTER — Other Ambulatory Visit: Payer: Self-pay

## 2020-11-11 DIAGNOSIS — R112 Nausea with vomiting, unspecified: Secondary | ICD-10-CM | POA: Insufficient documentation

## 2020-11-11 DIAGNOSIS — K219 Gastro-esophageal reflux disease without esophagitis: Secondary | ICD-10-CM | POA: Insufficient documentation

## 2020-11-11 DIAGNOSIS — Z87891 Personal history of nicotine dependence: Secondary | ICD-10-CM | POA: Insufficient documentation

## 2020-11-11 DIAGNOSIS — M549 Dorsalgia, unspecified: Secondary | ICD-10-CM | POA: Insufficient documentation

## 2020-11-11 DIAGNOSIS — R109 Unspecified abdominal pain: Secondary | ICD-10-CM | POA: Diagnosis present

## 2020-11-11 DIAGNOSIS — R1011 Right upper quadrant pain: Secondary | ICD-10-CM | POA: Insufficient documentation

## 2020-11-11 LAB — LIPASE, BLOOD: Lipase: 29 U/L (ref 11–51)

## 2020-11-11 LAB — URINALYSIS, COMPLETE (UACMP) WITH MICROSCOPIC
Bilirubin Urine: NEGATIVE
Glucose, UA: NEGATIVE mg/dL
Hgb urine dipstick: NEGATIVE
Ketones, ur: NEGATIVE mg/dL
Leukocytes,Ua: NEGATIVE
Nitrite: NEGATIVE
Protein, ur: NEGATIVE mg/dL
Specific Gravity, Urine: 1.026 (ref 1.005–1.030)
pH: 6 (ref 5.0–8.0)

## 2020-11-11 LAB — COMPREHENSIVE METABOLIC PANEL
ALT: 11 U/L (ref 0–44)
AST: 22 U/L (ref 15–41)
Albumin: 4.2 g/dL (ref 3.5–5.0)
Alkaline Phosphatase: 64 U/L (ref 38–126)
Anion gap: 9 (ref 5–15)
BUN: 9 mg/dL (ref 6–20)
CO2: 29 mmol/L (ref 22–32)
Calcium: 9.3 mg/dL (ref 8.9–10.3)
Chloride: 99 mmol/L (ref 98–111)
Creatinine, Ser: 0.74 mg/dL (ref 0.44–1.00)
GFR, Estimated: >60 mL/min (ref >60)
Glucose, Bld: 86 mg/dL (ref 70–99)
Potassium: 3.6 mmol/L (ref 3.5–5.1)
Sodium: 137 mmol/L (ref 135–145)
Total Bilirubin: 0.6 mg/dL (ref 0.3–1.2)
Total Protein: 8.1 g/dL (ref 6.5–8.1)

## 2020-11-11 LAB — CBC
HCT: 40.6 % (ref 36.0–46.0)
Hemoglobin: 13 g/dL (ref 12.0–15.0)
MCH: 29.4 pg (ref 26.0–34.0)
MCHC: 32 g/dL (ref 30.0–36.0)
MCV: 91.9 fL (ref 80.0–100.0)
Platelets: 347 10*3/uL (ref 150–400)
RBC: 4.42 MIL/uL (ref 3.87–5.11)
RDW: 14 % (ref 11.5–15.5)
WBC: 6.8 10*3/uL (ref 4.0–10.5)
nRBC: 0 % (ref 0.0–0.2)

## 2020-11-11 MED ORDER — ONDANSETRON 4 MG PO TBDP: 4.0000 mg | ORAL_TABLET | Freq: Once | ORAL | Status: DC | PRN

## 2020-11-11 NOTE — ED Triage Notes (Addendum)
Pt arrived via POV with reports of abd pain, pt states the pain is from gallbladder and is due to have lap chole in 9 days by Dr. Tonna Boehringer.  PT states the pain has been there for the past week and worse over the past few days. Pt reports she has been vomiting and nauseated.  Pt has not taken any pain meds today because she wasn't sure if she was going to come in.  Pt also states she recently did an e-visit for sore throat and fever and was dx with strep pharyngitis and was given RX for Amoxicillin which she started  Yesterday.

## 2020-11-12 ENCOUNTER — Emergency Department
Admission: EM | Admit: 2020-11-12 | Discharge: 2020-11-12 | Disposition: A | Discharge status: Home / Self Care | Attending: Emergency Medicine | Admitting: Emergency Medicine

## 2020-11-12 ENCOUNTER — Emergency Department

## 2020-11-12 ENCOUNTER — Telehealth: Admitting: Family

## 2020-11-12 ENCOUNTER — Encounter: Payer: Self-pay | Admitting: Radiology

## 2020-11-12 DIAGNOSIS — R109 Unspecified abdominal pain: Secondary | ICD-10-CM

## 2020-11-12 DIAGNOSIS — R1011 Right upper quadrant pain: Secondary | ICD-10-CM

## 2020-11-12 LAB — CHLAMYDIA/NGC RT PCR (ARMC ONLY)
Chlamydia Tr: NOT DETECTED
N gonorrhoeae: NOT DETECTED

## 2020-11-12 LAB — WET PREP, GENITAL
Clue Cells Wet Prep HPF POC: NONE SEEN
Sperm: NONE SEEN
Trich, Wet Prep: NONE SEEN
Yeast Wet Prep HPF POC: NONE SEEN

## 2020-11-12 LAB — HCG, QUANTITATIVE, PREGNANCY: hCG, Beta Chain, Quant, S: 1 m[IU]/mL (ref <5)

## 2020-11-12 MED ORDER — FENTANYL CITRATE (PF) 100 MCG/2ML IJ SOLN
50.0000 ug | Freq: Once | INTRAMUSCULAR | Status: AC
  Administered 2020-11-12: 50 ug via INTRAVENOUS
  Filled 2020-11-12: qty 2

## 2020-11-12 MED ORDER — KETOROLAC TROMETHAMINE 30 MG/ML IJ SOLN
15.0000 mg | Freq: Once | INTRAMUSCULAR | Status: AC
  Administered 2020-11-12: 15 mg via INTRAVENOUS
  Filled 2020-11-12: qty 1

## 2020-11-12 MED ORDER — DICYCLOMINE HCL 10 MG PO CAPS: 10.0000 mg | ORAL_CAPSULE | Freq: Three times a day (TID) | ORAL | 0 refills | Status: AC | PRN

## 2020-11-12 MED ORDER — IOHEXOL 300 MG/ML  SOLN
100.0000 mL | Freq: Once | INTRAMUSCULAR | Status: AC | PRN
  Administered 2020-11-12: 100 mL via INTRAVENOUS

## 2020-11-12 MED ORDER — ONDANSETRON HCL 4 MG/2ML IJ SOLN
4.0000 mg | Freq: Once | INTRAMUSCULAR | Status: AC
  Administered 2020-11-12: 4 mg via INTRAVENOUS
  Filled 2020-11-12: qty 2

## 2020-11-12 MED ORDER — ACETAMINOPHEN 500 MG PO TABS
1000.0000 mg | ORAL_TABLET | Freq: Once | ORAL | Status: AC
  Administered 2020-11-12: 1000 mg via ORAL
  Filled 2020-11-12: qty 2

## 2020-11-12 MED ORDER — TECHNETIUM TC 99M MEBROFENIN IV KIT
5.0000 | PACK | Freq: Once | INTRAVENOUS | Status: AC | PRN
  Administered 2020-11-12: 5.2 via INTRAVENOUS

## 2020-11-12 NOTE — ED Notes (Signed)
Patient transported to [facility] at this time

## 2020-11-12 NOTE — ED Notes (Signed)
Pt returned from [facility]uc [facility].

## 2020-11-12 NOTE — ED Provider Notes (Signed)
[facility] Emergency Department Provider Note  ____________________________________________  Time seen: Approximately 3:37 AM  I have reviewed the triage vital signs and the nursing notes.   HISTORY  Chief Complaint Abdominal Pain   HPI Amanda Harrington is a 35 y.o. female who presents for evaluation of abdominal pain.  Patient reports that she has been struggling with abdominal pain for a while now.  Has had several ultrasounds of her gallbladder which were negative until she had a CT scan done  in October 2021.  The CT scan was concerning for intraluminal gallstones.  She has outpatient cholecystectomy scheduled in 8 days with Dr. Lysle Pearl.  Over the last week she has been having the pain more frequent.  The pain is identical to her prior episodes that were attributed to her gallbladder.  The pain is sharp located in the right side, radiating to the back associated with nausea and vomiting.  Over the last week she has had several episodes and then over the last 2 days the pain has been constant.  She has not been eating or drinking much at home.  No fever chills, no dysuria or hematuria, no diarrhea or constipation.  Patient has had an appendectomy in the past.  Patient denies lower abdominal pain.  Patient reports slightly heavier than normal vaginal discharge.  She reports that she recently found out that her husband cheated on her.  She denies any prior history of STDs.  Patient has a Nexplanon and has not had her period in several months.  Past Medical History:  Diagnosis Date  . Anemia    resolved  . Anxiety   . Complication of anesthesia    nausea and vomiting  . Degenerative disc disease, cervical   . Degenerative disc disease, lumbar   . Depression   . GERD (gastroesophageal reflux disease)   . Heart murmur    MVP  . Mitral valve prolapse   . PONV (postoperative nausea and vomiting)     There are no problems to display for this patient.   Past  Surgical History:  Procedure Laterality Date  . APPENDECTOMY     2012  . cerival disc fusion    . CERVICAL BIOPSY    . CESAREAN SECTION    . tummy tuck      Prior to Admission medications   Medication Sig Start Date End Date Taking? Authorizing Provider  acetaminophen (TYLENOL) 500 MG tablet Take 1,000 mg by mouth every 6 (six) hours as needed for moderate pain or headache.    [provider]  albuterol (VENTOLIN HFA) 108 (90 Base) MCG/ACT inhaler Inhale 2 puffs into the lungs every 6 (six) hours as needed for wheezing or shortness of breath.    [provider]  amoxicillin (AMOXIL) 500 MG capsule Take 1 capsule (500 mg total) by mouth 3 (three) times daily. 11/10/20   Kennyth Arnold, FNP  cephALEXin (KEFLEX) 500 MG capsule Take 1 capsule (500 mg total) by mouth 2 (two) times daily. Patient not taking: No sig reported 08/14/20   Lavonia Drafts, MD  clonazePAM (KLONOPIN) 1 MG tablet Take 1 mg by mouth 2 (two) times daily as needed for anxiety.    [provider]  etonogestrel (NEXPLANON) 68 MG IMPL implant 68 mg by Subdermal route once.    [provider]  Fluticasone-Salmeterol (ADVAIR) 250-50 MCG/DOSE AEPB Inhale 1 puff into the lungs 2 (two) times daily as needed (shortness of breath).    [provider]  ibuprofen (ADVIL) 200 MG tablet Take 800 mg by mouth every 6 (six) hours as needed for headache or cramping.    [provider]  metoprolol succinate (TOPROL-XL) 50 MG 24 hr tablet Take 50 mg by mouth daily. 07/30/20   [provider]  Multiple Vitamins-Minerals (ADULT GUMMY PO) Take 1 capsule by mouth daily.    [provider]  omeprazole (PRILOSEC) 40 MG capsule Take 40 mg by mouth daily.    [provider]  oxymetazoline (AFRIN) 0.05 % nasal spray Place 1 spray into both nostrils 2 (two) times daily as needed for congestion.    [provider]  traZODone (DESYREL) 50 MG tablet Take 50-100 mg by  mouth at bedtime as needed for sleep. 10/11/20   [provider]  venlafaxine XR (EFFEXOR-XR) 150 MG 24 hr capsule Take 150 mg by mouth daily with breakfast.    [provider]  venlafaxine XR (EFFEXOR-XR) 75 MG 24 hr capsule Take 75 mg by mouth daily with breakfast.    [provider]    Allergies Phenergan [promethazine hcl]  History reviewed. No pertinent family history.  Social History Social History   Tobacco Use  . Smoking status: Former Smoker    Types: Cigarettes  . Smokeless tobacco: Never Used  Vaping Use  . Vaping Use: Some days  Substance Use Topics  . Alcohol use: Yes    Comment: occ  . Drug use: Not Currently    Types: Marijuana    Review of Systems  Constitutional: Negative for fever. Eyes: Negative for visual changes. ENT: Negative for sore throat. Neck: No neck pain  Cardiovascular: Negative for chest pain. Respiratory: Negative for shortness of breath. Gastrointestinal: + right sided abdominal pain, nausea, and vomiting. No diarrhea. Genitourinary: Negative for dysuria. Musculoskeletal: Negative for back pain. Skin: Negative for rash. Neurological: Negative for headaches, weakness or numbness. Psych: No SI or HI  ____________________________________________   PHYSICAL EXAM:  VITAL SIGNS: ED Triage Vitals  Enc Vitals Group     BP 11/11/20 1757 121/76     Pulse Rate 11/11/20 1757 79     Resp 11/11/20 1757 18     Temp 11/11/20 1757 98.6 F (37 C)     Temp Source 11/11/20 1757 Oral     SpO2 11/11/20 1757 100 %     Weight 11/11/20 1800 137 lb (62.1 kg)     Height 11/11/20 1800 5' 2"  (1.575 m)     Head Circumference --      Peak Flow --      Pain Score 11/11/20 1754 10     Pain Loc --      Pain Edu? --      Excl. in [place]? --     Constitutional: Alert and oriented. Well appearing and in no apparent distress. HEENT:      Head: Normocephalic and atraumatic.         Eyes: Conjunctivae are normal. Sclera is  non-icteric.       Mouth/Throat: Mucous membranes are moist.       Neck: Supple with no signs of meningismus. Cardiovascular: Regular rate and rhythm. No murmurs, gallops, or rubs. Respiratory: Normal respiratory effort. Lungs are clear to auscultation bilaterally.  Gastrointestinal: Soft, tender to palpation mostly over the epigastric region, negative Murphy's sign, and non distended with positive bowel sounds. No rebound or guarding. Genitourinary: No CVA tenderness. Pelvic exam: Normal external genitalia, no rashes or lesions. Thick white discharge. Os closed. No cervical  motion tenderness.  No uterine or adnexal tenderness.   Musculoskeletal: No edema, cyanosis, or erythema of extremities. Neurologic: Normal speech and language. Face is symmetric. Moving all extremities. No gross focal neurologic deficits are appreciated. Skin: Skin is warm, dry and intact. No rash noted. Psychiatric: Mood and affect are normal. Speech and behavior are normal.  ____________________________________________   LABS (all labs ordered are listed, but only abnormal results are displayed)  Labs Reviewed  WET PREP, GENITAL - Abnormal; Notable for the following components:      Result Value   WBC, Wet Prep HPF POC RARE (*)    All other components within normal limits  URINALYSIS, COMPLETE (UACMP) WITH MICROSCOPIC - Abnormal; Notable for the following components:   Color, Urine YELLOW (*)    APPearance HAZY (*)    Bacteria, UA RARE (*)    All other components within normal limits  CHLAMYDIA/NGC RT PCR ([facility] ONLY)  LIPASE, BLOOD  COMPREHENSIVE METABOLIC PANEL  CBC  HCG, QUANTITATIVE, PREGNANCY  POC URINE PREG, ED   ____________________________________________  EKG  none  ____________________________________________  RADIOLOGY  I have personally reviewed the images performed during this visit and I agree with the Radiologist's read.   Interpretation by Radiologist:  CT ABDOMEN PELVIS  W CONTRAST  Result Date: 11/12/2020 CLINICAL DATA:  Abdominal pain worsening over prior week. Patient is reportedly scheduled for cholecystectomy 9 days. EXAM: CT ABDOMEN AND PELVIS WITH CONTRAST TECHNIQUE: Multidetector CT imaging of the abdomen and pelvis was performed using the standard protocol following bolus administration of intravenous contrast. CONTRAST:  171m OMNIPAQUE IOHEXOL 300 MG/ML  SOLN COMPARISON:  08/11/2020 CT abdomen/pelvis. 11/12/2020 abdominal sonogram. FINDINGS: Lower chest: No significant pulmonary nodules or acute consolidative airspace disease. Hepatobiliary: Normal liver size. No liver mass. Normal gallbladder with no radiopaque cholelithiasis. No biliary ductal dilatation. Pancreas: Normal, with no mass or duct dilation. Spleen: Normal size. No mass. Adrenals/Urinary Tract: Normal adrenals. Normal kidneys with no hydronephrosis and no renal mass. Normal bladder. Stomach/Bowel: Normal non-distended stomach. No dilated or thick-walled small bowel loops. Appendectomy. Mild sigmoid diverticulosis with no large bowel wall thickening or significant pericolonic fat stranding. Vascular/Lymphatic: Normal caliber abdominal aorta. Patent portal, splenic, hepatic and renal veins. No pathologically enlarged lymph nodes in the abdomen or pelvis. Reproductive: Grossly normal uterus.  No adnexal mass. Other: No pneumoperitoneum, ascites or focal fluid collection. Musculoskeletal: No aggressive appearing focal osseous lesions. IMPRESSION: No acute abnormality. No evidence of bowel obstruction or acute bowel inflammation. Mild sigmoid diverticulosis, with no evidence of acute diverticulitis. Electronically Signed   By: JIlona SorrelM.D.   On: 11/12/2020 05:49   [place]DOM[place] LIMITED RUQ (LIVER/GB)  Result Date: 11/12/2020 CLINICAL DATA:  Right upper quadrant abdominal pain EXAM: ULTRASOUND ABDOMEN LIMITED RIGHT UPPER QUADRANT COMPARISON:  None. FINDINGS: Gallbladder: No gallstones or wall thickening  visualized. No sonographic Murphy sign noted by sonographer. Common bile duct: Diameter: 2-3 mm in proximal diameter Liver: No focal lesion identified. Within normal limits in parenchymal echogenicity. Portal vein is patent on color Doppler imaging with normal direction of blood flow towards the liver. Other: The visualized portion the pancreas is unremarkable IMPRESSION: Normal examination Electronically Signed   By: AFidela SalisburyMD   On: 11/12/2020 01:47     ____________________________________________   PROCEDURES  Procedure(s) performed: None Procedures Critical Care performed:  None ____________________________________________   INITIAL IMPRESSION / ASSESSMENT AND PLAN / ED COURSE  35y.o. female who presents for evaluation of abdominal pain.  Patient has been  having intermittent episodes of similar flank pain since 2019.  She had several ultrasounds in [place] which showed normal gallbladder.  I am able to see one from September 2019 which is normal.  Patient then had a CT scan done here in October 2021 consistent with gallstones.  She reports that her pain has become more pronounced over the last week.  She is scheduled for outpatient cholecystectomy in 8 days.  Here her vitals are within normal limits, no fever, no white count, normal LFTs, normal lipase, normal alk phos, normal T bili.  UA with no signs of UTI.  Negative pregnancy test.  Ultrasound was done here again and visualized by me.  Shows no signs of stone, confirmed by radiology.  Abdomen is soft with minimal epigastric tenderness, no localized rebound or guarding. She is also complaining of heavier then normal vaginal discharge and reports that she caught her husband cheating on her recently. Pelvic exam showing thick white discharge with no CMT, no uterine or adnexal tenderness. Swabs done to rule out STD.  Ddx GB pathology, PUD, gastritis, constipation, gastroenteritis, pancreatitis.  We will treat the pain with IV  fentanyl, IV Toradol, p.o. Tylenol, and Zofran and reassess.  At this time no indications for emergent cholecystectomy  _________________________ 5:57 AM on 11/12/2020 -----------------------------------------  [place] visualized by me with normal gallbladder no signs of stone, confirmed by radiology.  CT abdomen pelvis was done to rule out an alternative etiology for patient's pain and that is also negative.  Therefore will do a HIDA scan since patient's pain still not improved after 2 rounds of narcotics and Toradol.  I spoke with the nuclear medicine technician who says that she will only be able to do it around 7 or 8 AM.  Discussed that with patient who is in agreement to wait.     _________________________ 7:00 AM on 11/12/2020 -----------------------------------------  Care transferred to incoming MD pending HIDA scan.  _____________________________________________ Please note:  Patient was evaluated in Emergency Department today for the symptoms described in the history of present illness. Patient was evaluated in the context of the global COVID-19 pandemic, which necessitated consideration that the patient might be at risk for infection with the SARS-CoV-2 virus that causes COVID-19. Institutional protocols and algorithms that pertain to the evaluation of patients at risk for COVID-19 are in a state of rapid change based on information released by regulatory bodies including the CDC and federal and state organizations. These policies and algorithms were followed during the patient's care in the ED.  Some ED evaluations and interventions may be delayed as a result of limited staffing during the pandemic.   [place] Controlled Substance Database was reviewed by me. ____________________________________________   FINAL CLINICAL IMPRESSION(S) / ED DIAGNOSES   Final diagnoses:  RUQ abdominal pain      NEW MEDICATIONS STARTED DURING THIS VISIT:  ED Discharge Orders    None       Note:   This document was prepared using Dragon voice recognition software and may include unintentional dictation errors.    Rudene Re, MD 11/12/20 [id number]

## 2020-11-12 NOTE — ED Provider Notes (Signed)
Patient received in signout from Dr. Don Perking pending HIDA scan.  HIDA scan is normal.  Remains hemodynamically stable and appropriate for further work-up and management as an outpatient   Willy Eddy, MD 11/12/20 1239

## 2020-11-12 NOTE — Progress Notes (Signed)
Based on what you shared with me, I feel your condition warrants further evaluation and I recommend that you be seen for a face to face office visit.  Given your abdominal pain, you need to be seen face to face. I have reviewed your CT scan that was negative for acute issue for your abdominal pain.    NOTE: If you entered your credit card information for this eVisit, you will not be charged. You may see a "hold" on your card for the $35 but that hold will drop off and you will not have a charge processed.   If you are having a true medical emergency please call 911.      For an urgent face to face visit, [facility] has five urgent care centers for your convenience:     [facility] at [facility] Directions [phone] [address] [place], [place] [address] . 10 am - 6pm Monday - Friday    [facility]) Get Driving Directions [phone] [address] [place], [place] [address] . 10 am to 8 pm Monday-Friday . 12 pm to 8 pm [facility] at [facility] Get Driving Directions [phone] [address], [address] [place], [place] [address] . 8 am to 8 pm Monday-Friday . 9 am to 6 pm Saturday . 11 am to 6 pm Sunday     [facility] at [facility] Get Driving Directions  [phone] [address] [place], [place] [address] . 8 am to 8 pm Monday-Friday . 8 am to 4 pm [facility] at [facility] Directions [phone] [address] [place], [place] [address] . 12 pm to 6 pm Monday-Friday      Your e-visit answers were reviewed by a board certified advanced clinical practitioner to complete your personal care plan.  Thank you for using e-Visits.    Approximately 5 minutes was spent documenting and reviewing patient's chart.

## 2020-11-13 ENCOUNTER — Ambulatory Visit: Payer: Self-pay | Admitting: Surgery

## 2020-11-13 NOTE — H&P (View-Only) (Signed)
Subjective:   CC: Biliary colic [K80.50]  HPI:  Amanda Harrington is a 35 y.o. female who was referred by Emergency Room for evaluation of above CC. Symptoms were first noted several years ago. Pain is achy and intermittent, radiating from the epigastric area, to the upper back.  Associated with constant nausea, exacerbated by nothing specific.  Happens anytime of day.  PPI has not helped.  Mult ED visits with no definitive dx.  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  Past Surgical History: abdominoplasty, lap appy  Family History: family history includes Anxiety in her father; Colon cancer in her father; Heart disease in her father and mother.  Social History:  reports that she has been smoking cigarettes. She has never used smokeless tobacco. She reports current alcohol use of about 3.0 standard drinks of alcohol per week. No history on file for drug use.  Current Medications: has a current medication list which includes the following prescription(s): albuterol, cephalexin, clonazepam, fluticasone propionate, gabapentin, metoprolol succinate, omeprazole, promethazine, venlafaxine, and venlafaxine.  Allergies:  Allergies as of 08/21/2020  . (Not on File)    ROS:  A 15 point review of systems was performed and pertinent positives and negatives noted in HPI    Objective:     BP 118/83   Pulse (!) 112   Ht 157.5 cm (5' 2")   Wt 64.4 kg (142 lb)   BMI 25.97 kg/m    Constitutional :  alert, appears stated age, cooperative and no distress  Lymphatics/Throat:  no asymmetry, masses, or scars  Respiratory:  clear to auscultation bilaterally  Cardiovascular:  regular rate and rhythm  Gastrointestinal: soft, no guarding, mild TTP epigastric/RUQ.    Musculoskeletal: Steady  gait and movement  Skin: Cool and moist  Psychiatric: Normal affect, non-agitated, not confused       LABS:  n/a   RADS: CLINICAL DATA: Abdominal pain and fever. Patient reports muscle  repair and tummy tuck May 10, 2020 at an outside institution.   EXAM:  CT ABDOMEN AND PELVIS WITH CONTRAST   TECHNIQUE:  Multidetector CT imaging of the abdomen and pelvis was performed  using the standard protocol following bolus administration of  intravenous contrast.   CONTRAST: 100mL OMNIPAQUE IOHEXOL 300 MG/ML SOLN   COMPARISON: None.   FINDINGS:  Lower chest: Lung bases are clear.   Hepatobiliary: No focal hepatic abnormality. Suspect intraluminal  gallstones without abnormal gallbladder distention or  pericholecystic inflammation. There is no biliary dilatation.   Pancreas: No ductal dilatation or inflammation.   Spleen: Normal in size without focal abnormality.   Adrenals/Urinary Tract: Normal adrenal glands. No hydronephrosis or  perinephric edema. Homogeneous renal enhancement. Urinary bladder is  physiologically distended without wall thickening.   Stomach/Bowel: Nondistended stomach. Normal positioning of the  duodenum and ligament of Treitz. There is no bowel obstruction or  inflammation. Lipomatous hypertrophy of the ileocecal valve.  Appendix is not seen, there are surgical clips adjacent to the cecum  suggesting prior appendectomy per liquid stool in the proximal most  colon. Formed stool in the transverse colon. Occasional diverticula  involving the descending colon. No colonic inflammation or  pericolonic edema.   Vascular/Lymphatic: Normal caliber abdominal aorta. Patent portal  vein. No enlarged abdominopelvic lymph nodes. Few prominent  bilateral inguinal nodes are likely   reactive.   Reproductive: Anteverted uterus which is unremarkable. Symmetric  appearing ovaries with uniformly sized follicles. No suspicious  adnexal mass. Trace free fluid in the  pelvis.   Other: Trace free fluid in the pelvis, no other free fluid or  ascites. There is no free air. Postsurgical change of the lower  anterior abdominal wall with stranding. No discrete focal fluid  collection. No soft tissue air. Postsurgical change at the  umbilicus.   Musculoskeletal: There are no acute or suspicious osseous  abnormalities. No intramuscular fluid collection.   IMPRESSION:  1. Postsurgical change of the lower anterior abdominal wall with  stranding. No discrete focal fluid collection or soft tissue air.  This may represent postsurgical scarring or infection.  2. Suspect intraluminal gallstones without gallbladder inflammation.  3. Minimal colonic diverticulosis without diverticulitis.    Electronically Signed  By: Melanie Sanford M.D.  On: 08/11/2020 20:21  Assessment:      Biliary colic [K80.50], HIDA vs proceeding with surgery, due to lack of objective findings of acute chole.  Pt requested to have something done to relieve pain, so will proceed with surgery.  Plan:     1. Biliary colic [K80.50] Discussed the risk of surgery including post-op infxn, seroma, biloma, chronic pain, poor-delayed wound healing, retained gallstone, conversion to open procedure, post-op SBO or ileus, and need for additional procedures to address said risks.  The risks of general anesthetic including MI, CVA, sudden death or even reaction to anesthetic medications also discussed. Alternatives include continued observation.  Benefits include possible symptom relief, prevention of complications including acute cholecystitis, pancreatitis.  Typical post operative recovery of 3-5 days rest, continued pain in area and incision sites, possible loose stools up to 4-6 weeks, also discussed.  ED return precautions given for sudden increase in RUQ pain, with possible accompanying fever, nausea, and/or vomiting.  The patient understands the risks, any and all questions were answered  to the patient's satisfaction.  2. Patient has elected to proceed with surgical treatment. Procedure will be scheduled.  Written consent was obtained .robotic assisted laparoscopic   

## 2020-11-13 NOTE — H&P (Signed)
Subjective:   CC: Biliary colic [K80.50]  HPI: Amanda Harrington is a 35 y.o. female who was referred by Emergency Room for evaluation of above CC. Symptoms were first noted several years ago. Pain is achy and intermittent, radiating from the epigastric area, to the upper back. Associated with constant nausea, exacerbated by nothing specific. Happens anytime of day.  PPI has not helped. Mult ED visits with no definitive dx. 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  Past Surgical History: abdominoplasty, lap appy  Family History: family history includes Anxiety in her father; Colon cancer in her father; Heart disease in her father and mother.  Social History: reports that she has been smoking cigarettes. She has never used smokeless tobacco. She reports current alcohol use of about 3.0 standard drinks of alcohol per week. No history on file for drug use.  Current Medications: has a current medication list which includes the following prescription(s): albuterol, cephalexin, clonazepam, fluticasone propionate, gabapentin, metoprolol succinate, omeprazole, promethazine, venlafaxine, and venlafaxine.  Allergies:  Allergies as of 08/21/2020   (Not on File)   ROS:  A 15 point review of systems was performed and pertinent positives and negatives noted in HPI   Objective:    BP 118/83   Pulse (!) 112   Ht 157.5 cm (5\' 2" )   Wt 64.4 kg (142 lb)   BMI 25.97 kg/m   Constitutional : alert, appears stated age, cooperative and no distress  Lymphatics/Throat: no asymmetry, masses, or scars  Respiratory: clear to auscultation bilaterally  Cardiovascular: regular rate and rhythm  Gastrointestinal: soft, no guarding, mild TTP epigastric/RUQ.  Musculoskeletal: Steady gait and movement  Skin:  Cool and moist  Psychiatric: Normal affect, non-agitated, not confused    LABS:  n/a   RADS: CLINICAL DATA: Abdominal pain and fever. Patient reports muscle  repair and tummy tuck May 10, 2020 at an outside institution.   EXAM:  CT ABDOMEN AND PELVIS WITH CONTRAST   TECHNIQUE:  Multidetector CT imaging of the abdomen and pelvis was performed  using the standard protocol following bolus administration of  intravenous contrast.   CONTRAST: 100mL OMNIPAQUE IOHEXOL 300 MG/ML SOLN   COMPARISON: None.   FINDINGS:  Lower chest: Lung bases are clear.   Hepatobiliary: No focal hepatic abnormality. Suspect intraluminal  gallstones without abnormal gallbladder distention or  pericholecystic inflammation. There is no biliary dilatation.   Pancreas: No ductal dilatation or inflammation.   Spleen: Normal in size without focal abnormality.   Adrenals/Urinary Tract: Normal adrenal glands. No hydronephrosis or  perinephric edema. Homogeneous renal enhancement. Urinary bladder is  physiologically distended without wall thickening.   Stomach/Bowel: Nondistended stomach. Normal positioning of the  duodenum and ligament of Treitz. There is no bowel obstruction or  inflammation. Lipomatous hypertrophy of the ileocecal valve.  Appendix is not seen, there are surgical clips adjacent to the cecum  suggesting prior appendectomy per liquid stool in the proximal most  colon. Formed stool in the transverse colon. Occasional diverticula  involving the descending colon. No colonic inflammation or  pericolonic edema.   Vascular/Lymphatic: Normal caliber abdominal aorta. Patent portal  vein. No enlarged abdominopelvic lymph nodes. Few prominent  bilateral inguinal nodes are likely reactive.   Reproductive: Anteverted uterus which is unremarkable. Symmetric  appearing ovaries with uniformly sized follicles. No suspicious  adnexal mass. Trace  free fluid in the pelvis.   Other: Trace free  fluid in the pelvis, no other free fluid or  ascites. There is no free air. Postsurgical change of the lower  anterior abdominal wall with stranding. No discrete focal fluid  collection. No soft tissue air. Postsurgical change at the  umbilicus.   Musculoskeletal: There are no acute or suspicious osseous  abnormalities. No intramuscular fluid collection.   IMPRESSION:  1. Postsurgical change of the lower anterior abdominal wall with  stranding. No discrete focal fluid collection or soft tissue air.  This may represent postsurgical scarring or infection.  2. Suspect intraluminal gallstones without gallbladder inflammation.  3. Minimal colonic diverticulosis without diverticulitis.   Electronically Signed  By: Narda Rutherford M.D.  On: 08/11/2020 20:21  Assessment:    Biliary colic [K80.50], HIDA vs proceeding with surgery, due to lack of objective findings of acute chole. Pt requested to have something done to relieve pain, so will proceed with surgery.  Plan:    1. Biliary colic [K80.50] Discussed the risk of surgery including post-op infxn, seroma, biloma, chronic pain, poor-delayed wound healing, retained gallstone, conversion to open procedure, post-op SBO or ileus, and need for additional procedures to address said risks. The risks of general anesthetic including MI, CVA, sudden death or even reaction to anesthetic medications also discussed. Alternatives include continued observation. Benefits include possible symptom relief, prevention of complications including acute cholecystitis, pancreatitis.  Typical post operative recovery of 3-5 days rest, continued pain in area and incision sites, possible loose stools up to 4-6 weeks, also discussed.  ED return precautions given for sudden increase in RUQ pain, with possible accompanying fever, nausea, and/or vomiting.  The patient understands the risks, any and all questions were answered to the patient's satisfaction.  2.  Patient has elected to proceed with surgical treatment. Procedure will be scheduled. Written consent was obtained .robotic assisted laparoscopic

## 2020-11-14 ENCOUNTER — Other Ambulatory Visit
Admission: RE | Admit: 2020-11-14 | Discharge: 2020-11-14 | Disposition: A | Discharge status: Home / Self Care | Source: Ambulatory Visit | Attending: Surgery | Admitting: Surgery

## 2020-11-14 ENCOUNTER — Other Ambulatory Visit: Payer: Self-pay

## 2020-11-14 ENCOUNTER — Ambulatory Visit
Admission: RE | Admit: 2020-11-14 | Discharge: 2020-11-14 | Disposition: A | Discharge status: Home / Self Care | Source: Ambulatory Visit | Attending: Surgery | Admitting: Surgery

## 2020-11-14 HISTORY — DX: Malignant neoplasm of cervix uteri, unspecified: C53.9

## 2020-11-14 HISTORY — DX: Unspecified chronic bronchitis: J42

## 2020-11-14 NOTE — Patient Instructions (Addendum)
Your procedure is scheduled on:  Monday, January 10 Report to the Registration Desk on the 1st floor of the [facility]. To find out your arrival time, please call [phone] between 1PM - 3PM on: Friday, January 7  REMEMBER: Instructions that are not followed completely may result in serious medical risk, up to and including death; or upon the discretion of your surgeon and anesthesiologist your surgery may need to be rescheduled.  Do not eat food after midnight the night before surgery.  No gum chewing, lozengers or hard candies.  You may however, drink CLEAR liquids up to 2 hours before you are scheduled to arrive for your surgery. Do not drink anything within 2 hours of your scheduled arrival time.  Clear liquids include: - water  - apple juice without pulp - gatorade (not RED, PURPLE, OR BLUE) - black coffee or tea (Do NOT add milk or creamers to the coffee or tea) Do NOT drink anything that is not on this list.  TAKE THESE MEDICATIONS THE MORNING OF SURGERY WITH A SIP OF WATER:  1.  Albuterol inhaler 2.  Metoprolol 3.  Omeprazole - (take one the night before and one on the morning of surgery - helps to prevent nausea after surgery.) 4.  Venlafaxine  Use inhalers on the day of surgery and bring to the hospital.  One week prior to surgery: Stop Anti-inflammatories (NSAIDS) such as Advil, Aleve, Ibuprofen, Motrin, Naproxen, Naprosyn and Aspirin based products such as Excedrin, Goodys Powder, BC Powder. Stop ANY OVER THE COUNTER supplements until after surgery. (However, you may continue taking multivitamin up until the day before surgery.)  No Alcohol for 24 hours before or after surgery.  No Smoking including e-cigarettes for 24 hours prior to surgery.  No chewable tobacco products for at least 6 hours prior to surgery.  No nicotine patches on the day of surgery.  Do not use any "recreational" drugs for at least a week prior to your surgery.  Please be advised that  the combination of cocaine and anesthesia may have negative outcomes, up to and including death. If you test positive for cocaine, your surgery will be cancelled.  On the morning of surgery brush your teeth with toothpaste and water, you may rinse your mouth with mouthwash if you wish. Do not swallow any toothpaste or mouthwash.  Do not wear jewelry, make-up, hairpins, clips or nail polish.  Do not wear lotions, powders, or perfumes.   Do not shave body from the neck down 48 hours prior to surgery just in case you cut yourself which could leave a site for infection.  Also, freshly shaved skin may become irritated if using the CHG soap.  Contact lenses, hearing aids and dentures may not be worn into surgery.  Do not bring valuables to the hospital. [facility] is not responsible for any missing/lost belongings or valuables.   Use CHG Soap as directed on instruction sheet.  Notify your doctor if there is any change in your medical condition (cold, fever, infection).  Wear comfortable clothing (specific to your surgery type) to the hospital.  Plan for stool softeners for home use; pain medications have a tendency to cause constipation. You can also help prevent constipation by eating foods high in fiber such as fruits and vegetables and drinking plenty of fluids as your diet allows.  After surgery, you can help prevent lung complications by doing breathing exercises.  Take deep breaths and cough every 1-2 hours. Your doctor may order a  device called an Incentive Spirometer to help you take deep breaths. When coughing or sneezing, hold a pillow firmly against your incision with both hands. This is called splinting. Doing this helps protect your incision. It also decreases belly discomfort.  If you are being discharged the day of surgery, you will not be allowed to drive home. You will need a responsible adult (18 years or older) to drive you home and stay with you that night.   If you  are taking public transportation, you will need to have a responsible adult (18 years or older) with you. Please confirm with your physician that it is acceptable to use public transportation.   Please call the Pre-admissions Testing Dept. at [phone] if you have any questions about these instructions.  Visitation Policy:  Patients undergoing a surgery or procedure may have one family member or support person with them as long as that person is not COVID-19 positive or experiencing its symptoms.  That person may remain in the waiting area during the procedure.

## 2020-11-16 ENCOUNTER — Other Ambulatory Visit
Admission: RE | Admit: 2020-11-16 | Discharge: 2020-11-16 | Disposition: A | Discharge status: Home / Self Care | Source: Ambulatory Visit | Attending: Surgery | Admitting: Surgery

## 2020-11-16 ENCOUNTER — Other Ambulatory Visit: Payer: Self-pay

## 2020-11-16 DIAGNOSIS — Z01812 Encounter for preprocedural laboratory examination: Secondary | ICD-10-CM | POA: Insufficient documentation

## 2020-11-16 DIAGNOSIS — Z20822 Contact with and (suspected) exposure to covid-19: Secondary | ICD-10-CM | POA: Insufficient documentation

## 2020-11-17 ENCOUNTER — Telehealth: Admitting: Family

## 2020-11-17 DIAGNOSIS — N760 Acute vaginitis: Secondary | ICD-10-CM

## 2020-11-17 LAB — SARS CORONAVIRUS 2 (TAT 6-24 HRS): SARS Coronavirus 2: NEGATIVE

## 2020-11-17 MED ORDER — FLUCONAZOLE 150 MG PO TABS: 150.0000 mg | ORAL_TABLET | Freq: Once | ORAL | 0 refills | Status: AC

## 2020-11-17 NOTE — Progress Notes (Signed)

## 2020-11-20 MED ORDER — LACTATED RINGERS IV SOLN: INTRAVENOUS | Status: DC

## 2020-11-20 MED ORDER — INDOCYANINE GREEN 25 MG IV SOLR
1.2500 mg | Freq: Once | INTRAVENOUS | Status: DC
  Filled 2020-11-20: qty 10

## 2020-11-20 MED ORDER — CHLORHEXIDINE GLUCONATE CLOTH 2 % EX PADS
6.0000 | MEDICATED_PAD | Freq: Once | CUTANEOUS | Status: AC
  Administered 2020-12-01: 6 via TOPICAL

## 2020-11-20 MED ORDER — CHLORHEXIDINE GLUCONATE 0.12 % MT SOLN: 15.0000 mL | Freq: Once | OROMUCOSAL | Status: AC

## 2020-11-20 MED ORDER — ACETAMINOPHEN 500 MG PO TABS: 1000.0000 mg | ORAL_TABLET | ORAL | Status: AC

## 2020-11-20 MED ORDER — CELECOXIB 200 MG PO CAPS: 200.0000 mg | ORAL_CAPSULE | ORAL | Status: AC

## 2020-11-20 MED ORDER — ORAL CARE MOUTH RINSE: 15.0000 mL | Freq: Once | OROMUCOSAL | Status: AC

## 2020-11-20 MED ORDER — GABAPENTIN 300 MG PO CAPS: 300.0000 mg | ORAL_CAPSULE | ORAL | Status: AC

## 2020-11-20 MED ORDER — CEFAZOLIN SODIUM-DEXTROSE 2-4 GM/100ML-% IV SOLN: 2.0000 g | INTRAVENOUS | Status: AC

## 2020-11-22 ENCOUNTER — Other Ambulatory Visit

## 2020-11-23 ENCOUNTER — Other Ambulatory Visit

## 2020-11-29 ENCOUNTER — Other Ambulatory Visit
Admission: RE | Admit: 2020-11-29 | Discharge: 2020-11-29 | Disposition: A | Discharge status: Home / Self Care | Source: Ambulatory Visit | Attending: Surgery | Admitting: Surgery

## 2020-11-29 ENCOUNTER — Other Ambulatory Visit: Payer: Self-pay

## 2020-11-29 DIAGNOSIS — Z01812 Encounter for preprocedural laboratory examination: Secondary | ICD-10-CM | POA: Insufficient documentation

## 2020-11-29 DIAGNOSIS — Z20822 Contact with and (suspected) exposure to covid-19: Secondary | ICD-10-CM | POA: Insufficient documentation

## 2020-11-29 LAB — SARS CORONAVIRUS 2 (TAT 6-24 HRS): SARS Coronavirus 2: NEGATIVE

## 2020-12-01 ENCOUNTER — Ambulatory Visit: Admitting: Anesthesiology

## 2020-12-01 ENCOUNTER — Encounter: Payer: Self-pay | Admitting: Surgery

## 2020-12-01 ENCOUNTER — Other Ambulatory Visit: Payer: Self-pay

## 2020-12-01 ENCOUNTER — Ambulatory Visit
Admission: RE | Admit: 2020-12-01 | Discharge: 2020-12-01 | Disposition: A | Discharge status: Home / Self Care | Attending: Surgery | Admitting: Surgery

## 2020-12-01 ENCOUNTER — Encounter
Admission: RE | Disposition: A | Discharge status: Home / Self Care | Payer: Self-pay | Source: Home / Self Care | Attending: Surgery

## 2020-12-01 DIAGNOSIS — F17210 Nicotine dependence, cigarettes, uncomplicated: Secondary | ICD-10-CM | POA: Insufficient documentation

## 2020-12-01 DIAGNOSIS — K219 Gastro-esophageal reflux disease without esophagitis: Secondary | ICD-10-CM | POA: Diagnosis not present

## 2020-12-01 DIAGNOSIS — Z8249 Family history of ischemic heart disease and other diseases of the circulatory system: Secondary | ICD-10-CM | POA: Diagnosis not present

## 2020-12-01 DIAGNOSIS — K828 Other specified diseases of gallbladder: Secondary | ICD-10-CM | POA: Insufficient documentation

## 2020-12-01 DIAGNOSIS — K5730 Diverticulosis of large intestine without perforation or abscess without bleeding: Secondary | ICD-10-CM | POA: Insufficient documentation

## 2020-12-01 DIAGNOSIS — Z7951 Long term (current) use of inhaled steroids: Secondary | ICD-10-CM | POA: Insufficient documentation

## 2020-12-01 DIAGNOSIS — K8044 Calculus of bile duct with chronic cholecystitis without obstruction: Secondary | ICD-10-CM | POA: Diagnosis present

## 2020-12-01 DIAGNOSIS — Z800 Family history of malignant neoplasm of digestive organs: Secondary | ICD-10-CM | POA: Diagnosis not present

## 2020-12-01 DIAGNOSIS — Z79899 Other long term (current) drug therapy: Secondary | ICD-10-CM | POA: Insufficient documentation

## 2020-12-01 DIAGNOSIS — K811 Chronic cholecystitis: Secondary | ICD-10-CM | POA: Diagnosis not present

## 2020-12-01 SURGERY — CHOLECYSTECTOMY, ROBOT-ASSISTED, LAPAROSCOPIC
Anesthesia: General | Site: Abdomen

## 2020-12-01 MED ORDER — ONDANSETRON HCL 4 MG/2ML IJ SOLN
INTRAMUSCULAR | Status: DC | PRN
  Administered 2020-12-01: 4 mg via INTRAVENOUS

## 2020-12-01 MED ORDER — PROPOFOL 10 MG/ML IV BOLUS
INTRAVENOUS | Status: AC
  Filled 2020-12-01: qty 40

## 2020-12-01 MED ORDER — IBUPROFEN 800 MG PO TABS: 800.0000 mg | ORAL_TABLET | Freq: Three times a day (TID) | ORAL | 0 refills | Status: DC | PRN

## 2020-12-01 MED ORDER — FENTANYL CITRATE (PF) 100 MCG/2ML IJ SOLN
INTRAMUSCULAR | Status: DC | PRN
  Administered 2020-12-01 (×2): 50 ug via INTRAVENOUS

## 2020-12-01 MED ORDER — HYDROCODONE-ACETAMINOPHEN 5-325 MG PO TABS: 1.0000 | ORAL_TABLET | Freq: Four times a day (QID) | ORAL | 0 refills | Status: DC | PRN

## 2020-12-01 MED ORDER — SCOPOLAMINE 1 MG/3DAYS TD PT72: 1.0000 | MEDICATED_PATCH | TRANSDERMAL | Status: DC

## 2020-12-01 MED ORDER — ACETAMINOPHEN 500 MG PO TABS
ORAL_TABLET | ORAL | Status: AC
  Administered 2020-12-01: 1000 mg via ORAL
  Filled 2020-12-01: qty 2

## 2020-12-01 MED ORDER — CEFAZOLIN SODIUM-DEXTROSE 2-4 GM/100ML-% IV SOLN
2.0000 g | INTRAVENOUS | Status: AC
  Administered 2020-12-01: 2 g via INTRAVENOUS

## 2020-12-01 MED ORDER — DEXMEDETOMIDINE (PRECEDEX) IN NS 20 MCG/5ML (4 MCG/ML) IV SYRINGE
PREFILLED_SYRINGE | INTRAVENOUS | Status: DC | PRN
  Administered 2020-12-01 (×2): 4 ug via INTRAVENOUS

## 2020-12-01 MED ORDER — GABAPENTIN 300 MG PO CAPS: 300.0000 mg | ORAL_CAPSULE | ORAL | Status: AC

## 2020-12-01 MED ORDER — MIDAZOLAM HCL 2 MG/2ML IJ SOLN
INTRAMUSCULAR | Status: AC
  Filled 2020-12-01: qty 2

## 2020-12-01 MED ORDER — CHLORHEXIDINE GLUCONATE 0.12 % MT SOLN
OROMUCOSAL | Status: AC
  Administered 2020-12-01: 15 mL via OROMUCOSAL
  Filled 2020-12-01: qty 15

## 2020-12-01 MED ORDER — ACETAMINOPHEN 325 MG PO TABS: 650.0000 mg | ORAL_TABLET | Freq: Three times a day (TID) | ORAL | 0 refills | Status: AC | PRN

## 2020-12-01 MED ORDER — ROCURONIUM BROMIDE 100 MG/10ML IV SOLN
INTRAVENOUS | Status: DC | PRN
  Administered 2020-12-01: 50 mg via INTRAVENOUS

## 2020-12-01 MED ORDER — MEPERIDINE HCL 50 MG/ML IJ SOLN: 6.2500 mg | INTRAMUSCULAR | Status: DC | PRN

## 2020-12-01 MED ORDER — LIDOCAINE HCL (PF) 2 % IJ SOLN
INTRAMUSCULAR | Status: AC
  Filled 2020-12-01: qty 5

## 2020-12-01 MED ORDER — SEVOFLURANE IN SOLN
RESPIRATORY_TRACT | Status: AC
  Filled 2020-12-01: qty 250

## 2020-12-01 MED ORDER — CELECOXIB 200 MG PO CAPS
ORAL_CAPSULE | ORAL | Status: AC
  Administered 2020-12-01: 200 mg via ORAL
  Filled 2020-12-01: qty 1

## 2020-12-01 MED ORDER — LIDOCAINE-EPINEPHRINE (PF) 1 %-1:200000 IJ SOLN
INTRAMUSCULAR | Status: DC | PRN
  Administered 2020-12-01: 10 mL

## 2020-12-01 MED ORDER — DOCUSATE SODIUM 100 MG PO CAPS: 100.0000 mg | ORAL_CAPSULE | Freq: Two times a day (BID) | ORAL | 0 refills | Status: AC | PRN

## 2020-12-01 MED ORDER — FENTANYL CITRATE (PF) 100 MCG/2ML IJ SOLN
25.0000 ug | INTRAMUSCULAR | Status: AC | PRN
  Administered 2020-12-01 (×6): 25 ug via INTRAVENOUS

## 2020-12-01 MED ORDER — LIDOCAINE-EPINEPHRINE (PF) 1 %-1:200000 IJ SOLN
INTRAMUSCULAR | Status: AC
  Filled 2020-12-01: qty 30

## 2020-12-01 MED ORDER — PROPOFOL 10 MG/ML IV BOLUS
INTRAVENOUS | Status: DC | PRN
  Administered 2020-12-01: 150 mg via INTRAVENOUS

## 2020-12-01 MED ORDER — HYDROCODONE-ACETAMINOPHEN 5-325 MG PO TABS
1.0000 | ORAL_TABLET | Freq: Four times a day (QID) | ORAL | Status: AC | PRN
Start: 2020-12-01 — End: 2020-12-01
  Administered 2020-12-01: 1 via ORAL

## 2020-12-01 MED ORDER — ONDANSETRON HCL 4 MG/2ML IJ SOLN
INTRAMUSCULAR | Status: AC
  Filled 2020-12-01: qty 2

## 2020-12-01 MED ORDER — LIDOCAINE HCL (CARDIAC) PF 100 MG/5ML IV SOSY
PREFILLED_SYRINGE | INTRAVENOUS | Status: DC | PRN
  Administered 2020-12-01: 60 mg via INTRAVENOUS

## 2020-12-01 MED ORDER — BUPIVACAINE HCL (PF) 0.5 % IJ SOLN
INTRAMUSCULAR | Status: AC
  Filled 2020-12-01: qty 30

## 2020-12-01 MED ORDER — MIDAZOLAM HCL 2 MG/2ML IJ SOLN
INTRAMUSCULAR | Status: DC | PRN
  Administered 2020-12-01: 2 mg via INTRAVENOUS

## 2020-12-01 MED ORDER — ROCURONIUM BROMIDE 10 MG/ML (PF) SYRINGE
PREFILLED_SYRINGE | INTRAVENOUS | Status: AC
  Filled 2020-12-01: qty 10

## 2020-12-01 MED ORDER — FENTANYL CITRATE (PF) 100 MCG/2ML IJ SOLN
INTRAMUSCULAR | Status: AC
  Filled 2020-12-01: qty 2

## 2020-12-01 MED ORDER — ACETAMINOPHEN 500 MG PO TABS: 1000.0000 mg | ORAL_TABLET | ORAL | Status: AC

## 2020-12-01 MED ORDER — HYDROCODONE-ACETAMINOPHEN 5-325 MG PO TABS
ORAL_TABLET | ORAL | Status: AC
  Filled 2020-12-01: qty 1

## 2020-12-01 MED ORDER — ONDANSETRON HCL 4 MG/2ML IJ SOLN: 4.0000 mg | Freq: Once | INTRAMUSCULAR | Status: DC | PRN

## 2020-12-01 MED ORDER — GABAPENTIN 300 MG PO CAPS
ORAL_CAPSULE | ORAL | Status: AC
  Administered 2020-12-01: 300 mg via ORAL
  Filled 2020-12-01: qty 1

## 2020-12-01 MED ORDER — SUGAMMADEX SODIUM 200 MG/2ML IV SOLN
INTRAVENOUS | Status: DC | PRN
  Administered 2020-12-01: 100 mg via INTRAVENOUS

## 2020-12-01 MED ORDER — BUPIVACAINE HCL (PF) 0.5 % IJ SOLN
INTRAMUSCULAR | Status: DC | PRN
Start: 2020-12-01 — End: 2020-12-01
  Administered 2020-12-01: 10 mL

## 2020-12-01 MED ORDER — DEXAMETHASONE SODIUM PHOSPHATE 10 MG/ML IJ SOLN
INTRAMUSCULAR | Status: DC | PRN
  Administered 2020-12-01: 10 mg via INTRAVENOUS

## 2020-12-01 MED ORDER — CEFAZOLIN SODIUM-DEXTROSE 2-4 GM/100ML-% IV SOLN
INTRAVENOUS | Status: AC
  Filled 2020-12-01: qty 100

## 2020-12-01 MED ORDER — CELECOXIB 200 MG PO CAPS: 200.0000 mg | ORAL_CAPSULE | ORAL | Status: AC

## 2020-12-01 MED ORDER — INDOCYANINE GREEN 25 MG IV SOLR
1.2500 mg | Freq: Once | INTRAVENOUS | Status: AC
  Administered 2020-12-01: 1.25 mg via INTRAVENOUS
  Filled 2020-12-01: qty 0.5

## 2020-12-01 MED ORDER — SCOPOLAMINE 1 MG/3DAYS TD PT72
MEDICATED_PATCH | TRANSDERMAL | Status: AC
  Administered 2020-12-01: 1.5 mg via TRANSDERMAL
  Filled 2020-12-01: qty 1

## 2020-12-01 SURGICAL SUPPLY — 57 items
"PENCIL ELECTRO HAND CTR " (MISCELLANEOUS) ×2 IMPLANT
ANCHOR TIS RET SYS 235ML (MISCELLANEOUS) ×2 IMPLANT
BAG INFUSER PRESSURE 100CC (MISCELLANEOUS) IMPLANT
BLADE SURG SZ11 CARB STEEL (BLADE) ×3 IMPLANT
CANISTER SUCT 1200ML W/VALVE (MISCELLANEOUS) ×3 IMPLANT
CANNULA REDUC XI 12-8 STAPL (CANNULA) ×1
CANNULA REDUCER 12-8 DVNC XI (CANNULA) ×2 IMPLANT
CATH REDDICK CHOLANGI 4FR 50CM (CATHETERS) IMPLANT
CHLORAPREP W/TINT 26 (MISCELLANEOUS) ×3 IMPLANT
CLIP VESOLOCK MED LG 6/CT (CLIP) ×3 IMPLANT
COVER TIP SHEARS 8 DVNC (MISCELLANEOUS) ×2 IMPLANT
COVER TIP SHEARS 8MM DA VINCI (MISCELLANEOUS) ×1
COVER WAND RF STERILE (DRAPES) ×3 IMPLANT
DECANTER SPIKE VIAL GLASS SM (MISCELLANEOUS) ×6 IMPLANT
DEFOGGER SCOPE WARMER CLEARIFY (MISCELLANEOUS) ×3 IMPLANT
DERMABOND ADVANCED (GAUZE/BANDAGES/DRESSINGS) ×1
DERMABOND ADVANCED .7 DNX12 (GAUZE/BANDAGES/DRESSINGS) ×2 IMPLANT
DRAPE ARM DVNC X/XI (DISPOSABLE) ×8 IMPLANT
DRAPE C-ARM XRAY 36X54 (DRAPES) IMPLANT
DRAPE COLUMN DVNC XI (DISPOSABLE) ×2 IMPLANT
DRAPE DA VINCI XI ARM (DISPOSABLE) ×4
DRAPE DA VINCI XI COLUMN (DISPOSABLE) ×1
ELECT CAUTERY BLADE 6.4 (BLADE) ×3 IMPLANT
ELECT REM PT RETURN 9FT ADLT (ELECTROSURGICAL) ×3
ELECTRODE REM PT RTRN 9FT ADLT (ELECTROSURGICAL) ×2 IMPLANT
GLOVE SURG SYN 6.5 ES PF (GLOVE) ×6 IMPLANT
GLOVE SURG SYN 6.5 PF PI (GLOVE) ×4 IMPLANT
GLOVE SURG UNDER POLY LF SZ7 (GLOVE) ×6 IMPLANT
GOWN STRL REUS W/ TWL LRG LVL3 (GOWN DISPOSABLE) ×6 IMPLANT
GOWN STRL REUS W/TWL LRG LVL3 (GOWN DISPOSABLE) ×3
GRASPER SUT TROCAR 14GX15 (MISCELLANEOUS) ×1 IMPLANT
IRRIGATOR SUCT 8 DISP DVNC XI (IRRIGATION / IRRIGATOR) IMPLANT
IRRIGATOR SUCTION 8MM XI DISP (IRRIGATION / IRRIGATOR)
IV NS 1000ML (IV SOLUTION)
IV NS 1000ML BAXH (IV SOLUTION) IMPLANT
LABEL OR SOLS (LABEL) ×3 IMPLANT
MANIFOLD NEPTUNE II (INSTRUMENTS) ×3 IMPLANT
NDL INSUFFLATION 14GA 120MM (NEEDLE) ×2 IMPLANT
NEEDLE HYPO 22GX1.5 SAFETY (NEEDLE) ×3 IMPLANT
NEEDLE INSUFFLATION 14GA 120MM (NEEDLE) ×3 IMPLANT
NS IRRIG 500ML POUR BTL (IV SOLUTION) ×3 IMPLANT
OBTURATOR OPTICAL STANDARD 8MM (TROCAR) ×1
OBTURATOR OPTICAL STND 8 DVNC (TROCAR) ×2
OBTURATOR OPTICALSTD 8 DVNC (TROCAR) ×2 IMPLANT
PACK LAP CHOLECYSTECTOMY (MISCELLANEOUS) ×3 IMPLANT
PENCIL ELECTRO HAND CTR (MISCELLANEOUS) ×3 IMPLANT
SEAL CANN UNIV 5-8 DVNC XI (MISCELLANEOUS) ×6 IMPLANT
SEAL XI 5MM-8MM UNIVERSAL (MISCELLANEOUS) ×3
SET TUBE SMOKE EVAC HIGH FLOW (TUBING) ×3 IMPLANT
SOLUTION ELECTROLUBE (MISCELLANEOUS) ×3 IMPLANT
STAPLER CANNULA SEAL DVNC XI (STAPLE) ×2 IMPLANT
STAPLER CANNULA SEAL XI (STAPLE) ×1
SUT MNCRL 4-0 (SUTURE) ×2
SUT MNCRL 4-0 27XMFL (SUTURE) ×4
SUT VICRYL 0 AB UR-6 (SUTURE) ×3 IMPLANT
SUTURE MNCRL 4-0 27XMF (SUTURE) ×4 IMPLANT
SYR 30ML LL (SYRINGE) IMPLANT

## 2020-12-01 NOTE — Interval H&P Note (Signed)
History and Physical Interval Note:  12/01/2020 7:23 AM  Amanda Harrington  has presented today for surgery, with the diagnosis of K80.Marland [id number] Biliary colic.  The various methods of treatment have been discussed with the patient and family. After consideration of risks, benefits and other options for treatment, the patient has consented to  Procedure(s): XI ROBOTIC Hoffman (N/A) as a surgical intervention.  The patient's history has been reviewed, patient examined, no change in status, stable for surgery.  I have reviewed the patient's chart and labs.  Questions were answered to the patient's satisfaction.    Again, no guarantee symptoms will resolve after procedure.  Pt verbalized understanding and still wishes to proceed  Mohamed Portlock Lysle Pearl

## 2020-12-01 NOTE — Op Note (Signed)
Preoperative diagnosis:  biliary colic  Postoperative diagnosis: chronic cholecystitis Procedure: Robotic assisted Laparoscopic Cholecystectomy.   Anesthesia: GETA   Surgeon: Benjamine Sprague  Specimen: Gallbladder  Complications: None  EBL: 57mL  Wound Classification: Clean Contaminated  Indications: see HPI  Findings: Critical view of safety noted Cystic duct and artery identified, ligated and divided, clips remained intact at end of procedure Adequate hemostasis  Description of procedure:  The patient was placed on the operating table in the supine position. SCDs placed, pre-op abx administered.  General anesthesia was induced and OG tube placed by anesthesia. A time-out was completed verifying correct patient, procedure, site, positioning, and implant(s) and/or special equipment prior to beginning this procedure. The abdomen was prepped and draped in the usual sterile fashion.    Veress needle was placed at the Palmer's point and insufflation was started after confirming a positive saline drop test and no immediate increase in abdominal pressure.  After reaching 15 mm, the Veress needle was removed and a 8 mm port was placed via optiview technique under umbilicus measured 37TK from gallbladder.  The abdomen was inspected and no abnormalities or injuries were found.  Under direct vision, ports were placed in the following locations: One 12 mm patient left of the umbilicus, 8cm from the optiviewed port, one 8 mm port placed to the patient right of the umbilical port 8 cm apart.  1 additional 8 mm port placed lateral to the 13mm port.  Once ports were placed, The table was placed in the reverse Trendelenburg position with the right side up. The Xi platform was brought into the operative field and docked to the ports successfully.  An endoscope was placed through the umbilical port, fenestrated grasper through the adjacent patient right port, prograsp to the far patient left port, and then a  hook cautery in the left port.  The dome of the gallbladder was grasped with prograsp, passed and retracted over the dome of the liver. Adhesions between the gallbladder and omentum, duodenum and transverse colon were lysed via hook cautery. The infundibulum was grasped with the fenestrated grasper and retracted toward the right lower quadrant. This maneuver exposed Calot's triangle. The peritoneum overlying the gallbladder infundibulum was then dissected using  electrocautery hook and the cystic duct and cystic artery identified.  Critical view of safety with the liver bed clearly visible behind the duct and artery with no additional structures noted.  The cystic duct and cystic artery clipped and divided close to the gallbladder.     The gallbladder was then dissected from its peritoneal and liver bed attachments by electrocautery. Hemostasis was checked prior to removing the hook cautery and the Endo Catch bag was then placed through the 12 mm port and the gallbladder was removed.  The gallbladder was passed off the table as a specimen. There was no evidence of bleeding from the gallbladder fossa or cystic artery or leakage of the bile from the cystic duct stump. The 12 mm port site closed with PMI using 0 vicryl under direct vision.  Abdomen desufflated and secondary trocars were removed under direct vision. No bleeding was noted. All skin incisions then closed with subcuticular sutures of 4-0 monocryl and dressed with topical skin adhesive. The orogastric tube was removed and patient extubated.  The patient tolerated the procedure well and was taken to the postanesthesia care unit in stable condition.  All sponge and instrument count correct at end of procedure.

## 2020-12-01 NOTE — Anesthesia Postprocedure Evaluation (Signed)
Anesthesia Post Note  Patient: Amanda Harrington  Procedure(s) Performed: XI ROBOTIC ASSISTED LAPAROSCOPIC CHOLECYSTECTOMY (N/A Abdomen) INDOCYANINE GREEN FLUORESCENCE IMAGING (ICG)  Patient location during evaluation: PACU Anesthesia Type: General Level of consciousness: awake and alert, awake and oriented Pain management: pain level controlled Vital Signs Assessment: post-procedure vital signs reviewed and stable Respiratory status: spontaneous breathing, nonlabored ventilation and respiratory function stable Cardiovascular status: blood pressure returned to baseline and stable Postop Assessment: no apparent nausea or vomiting Anesthetic complications: no   No complications documented.   Last Vitals:  Vitals:   12/01/20 0640 12/01/20 0905  BP: 114/78 108/70  Pulse: (!) 102 100  Resp: 16 13  Temp: 36.5 C 37 C  SpO2: 100% 100%    Last Pain:  Vitals:   12/01/20 0905  TempSrc:   PainSc: 0-No pain                 Phill Mutter

## 2020-12-01 NOTE — Anesthesia Preprocedure Evaluation (Signed)
Anesthesia Evaluation  Patient identified by MRN, date of birth, ID band Patient awake    Reviewed: Allergy & Precautions, H&P , NPO status , Patient's Chart, lab work & pertinent test results  History of Anesthesia Complications (+) PONV and history of anesthetic complications  Airway Mallampati: II  TM Distance: >3 FB Neck ROM: Full    Dental no notable dental hx.    Pulmonary neg pulmonary ROS, former smoker,    Pulmonary exam normal        Cardiovascular Normal cardiovascular exam+ Valvular Problems/Murmurs MVP      Neuro/Psych PSYCHIATRIC DISORDERS Anxiety Depression negative neurological ROS     GI/Hepatic Neg liver ROS, GERD  Medicated and Controlled,  Endo/Other  negative endocrine ROS  Renal/GU negative Renal ROS  negative genitourinary   Musculoskeletal  (+) Arthritis , Osteoarthritis,    Abdominal   Peds negative pediatric ROS (+)  Hematology negative hematology ROS (+) anemia ,   Anesthesia Other Findings PONV after "tummy Tuck", required large dose narcotic--n/v  Reproductive/Obstetrics negative OB ROS                             Anesthesia Physical Anesthesia Plan  ASA: II  Anesthesia Plan: General   Post-op Pain Management:    Induction: Intravenous  PONV Risk Score and Plan: 3 and Ondansetron  Airway Management Planned: Oral ETT  Additional Equipment:   Intra-op Plan:   Post-operative Plan:   Informed Consent: I have reviewed the patients History and Physical, chart, labs and discussed the procedure including the risks, benefits and alternatives for the proposed anesthesia with the patient or authorized representative who has indicated his/her understanding and acceptance.     Dental advisory given  Plan Discussed with: CRNA and Anesthesiologist  Anesthesia Plan Comments:         Anesthesia Quick Evaluation

## 2020-12-01 NOTE — Anesthesia Procedure Notes (Signed)
Procedure Name: Intubation Date/Time: 12/01/2020 7:41 AM Performed by: Gentry Fitz, CRNA Pre-anesthesia Checklist: Patient identified, Emergency Drugs available, Suction available, Patient being monitored and Timeout performed Patient Re-evaluated:Patient Re-evaluated prior to induction Oxygen Delivery Method: Circle system utilized Preoxygenation: Pre-oxygenation with 100% oxygen Induction Type: IV induction and Cricoid Pressure applied Ventilation: Mask ventilation without difficulty Laryngoscope Size: Mac and 4 Grade View: Grade II Tube type: Oral Tube size: 7.0 mm Number of attempts: 1 Airway Equipment and Method: Stylet Placement Confirmation: ETT inserted through vocal cords under direct vision,  positive ETCO2 and breath sounds checked- equal and bilateral Secured at: 22 cm Tube secured with: Tape Dental Injury: Teeth and Oropharynx as per pre-operative assessment

## 2020-12-01 NOTE — Discharge Instructions (Signed)
  AMBULATORY SURGERY  DISCHARGE INSTRUCTIONS   1) The drugs that you were given will stay in your system until tomorrow so for the next 24 hours you should not:  A) Drive an automobile B) Make any legal decisions C) Drink any alcoholic beverage   2) You may resume regular meals tomorrow.  Today it is better to start with liquids and gradually work up to solid foods.  You may eat anything you prefer, but it is better to start with liquids, then soup and crackers, and gradually work up to solid foods.   3) Please notify your doctor immediately if you have any unusual bleeding, trouble breathing, redness and pain at the surgery site, drainage, fever, or pain not relieved by medication.    4) Additional Instructions:        Please contact your physician with any problems or Same Day Surgery at [phone], Monday through Friday 6 am to 4 pm, or  at [facility] number at [phone].Laparoscopic Cholecystectomy, Care After This sheet gives you information about how to care for yourself after your procedure. Your doctor may also give you more specific instructions. If you have problems or questions, contact your doctor. Follow these instructions at home: Care for cuts from surgery (incisions)   Follow instructions from your doctor about how to take care of your cuts from surgery. Make sure you: ? Wash your hands with soap and water before you change your bandage (dressing). If you cannot use soap and water, use hand sanitizer. ? Change your bandage as told by your doctor. ? Leave stitches (sutures), skin glue, or skin tape (adhesive) strips in place. They may need to stay in place for 2 weeks or longer. If tape strips get loose and curl up, you may trim the loose edges. Do not remove tape strips completely unless your doctor says it is okay.  Do not take baths, swim, or use a hot tub until your doctor says it is okay. OK TO SHOWER 24HRS AFTER YOUR SURGERY.    Check your surgical cut area every day for signs of infection. Check for: ? More redness, swelling, or pain. ? More fluid or blood. ? Warmth. ? Pus or a bad smell. Activity  Do not drive or use heavy machinery while taking prescription pain medicine.  Do not play contact sports until your doctor says it is okay.  Do not drive for 24 hours if you were given a medicine to help you relax (sedative).  Rest as needed. Do not return to work or school until your doctor says it is okay. General instructions .  tylenol and advil as needed for discomfort.  Please alternate between the two every four hours as needed for pain.   .  Use narcotics, if prescribed, only when tylenol and motrin is not enough to control pain. .  325-650mg every 8hrs to max of 3000mg/24hrs (including the 325mg in every norco dose) for the tylenol.   .  Advil up to 800mg per dose every 8hrs as needed for pain.    To prevent or treat constipation while you are taking prescription pain medicine, your doctor may recommend that you: ? Drink enough fluid to keep your pee (urine) clear or pale yellow. ? Take over-the-counter or prescription medicines. ? Eat foods that are high in fiber, such as fresh fruits and vegetables, whole grains, and beans. ? Limit foods that are high in fat and processed sugars, such as fried and sweet foods. Contact   a doctor if:  You develop a rash.  You have more redness, swelling, or pain around your surgical cuts.  You have more fluid or blood coming from your surgical cuts.  Your surgical cuts feel warm to the touch.  You have pus or a bad smell coming from your surgical cuts.  You have a fever.  One or more of your surgical cuts breaks open. Get help right away if:  You have trouble breathing.  You have chest pain.  You have pain that is getting worse in your shoulders.  You faint or feel dizzy when you stand.  You have very bad pain in your belly (abdomen).  You are sick  to your stomach (nauseous) for more than one day.  You have throwing up (vomiting) that lasts for more than one day.  You have leg pain. This information is not intended to replace advice given to you by your health care provider. Make sure you discuss any questions you have with your health care provider. Document Released: 08/06/2008 Document Revised: 05/18/2016 Document Reviewed: 04/15/2016 Elsevier Interactive Patient Education  2019 [employer]   

## 2020-12-01 NOTE — Transfer of Care (Signed)
Immediate Anesthesia Transfer of Care Note  Patient: Amanda Harrington  Procedure(s) Performed: XI ROBOTIC ASSISTED LAPAROSCOPIC CHOLECYSTECTOMY (N/A Abdomen) INDOCYANINE GREEN FLUORESCENCE IMAGING (ICG)  Patient Location: PACU  Anesthesia Type:General  Level of Consciousness: awake, drowsy and patient cooperative  Airway & Oxygen Therapy: Patient Spontanous Breathing and Patient connected to face mask oxygen  Post-op Assessment: Report given to RN and Post -op Vital signs reviewed and stable  Post vital signs: Reviewed and stable  Last Vitals:  Vitals Value Taken Time  BP 108/70 12/01/20 0905  Temp    Pulse 95 12/01/20 0906  Resp 13 12/01/20 0906  SpO2 100 % 12/01/20 0906  Vitals shown include unvalidated device data.  Last Pain:  Vitals:   12/01/20 0640  TempSrc: Temporal  PainSc: 0-No pain      Patients Stated Pain Goal: 0 (26/37/85 8850)  Complications: No complications documented.

## 2020-12-04 LAB — SURGICAL PATHOLOGY

## 2021-04-18 ENCOUNTER — Ambulatory Visit: Payer: Self-pay | Admitting: Family Medicine

## 2021-04-18 DIAGNOSIS — K219 Gastro-esophageal reflux disease without esophagitis: Secondary | ICD-10-CM | POA: Insufficient documentation

## 2021-04-18 DIAGNOSIS — G8929 Other chronic pain: Secondary | ICD-10-CM | POA: Insufficient documentation

## 2021-04-18 DIAGNOSIS — E785 Hyperlipidemia, unspecified: Secondary | ICD-10-CM | POA: Insufficient documentation

## 2021-04-18 DIAGNOSIS — M419 Scoliosis, unspecified: Secondary | ICD-10-CM | POA: Insufficient documentation

## 2021-04-18 DIAGNOSIS — Z9109 Other allergy status, other than to drugs and biological substances: Secondary | ICD-10-CM | POA: Insufficient documentation

## 2021-04-18 DIAGNOSIS — G44009 Cluster headache syndrome, unspecified, not intractable: Secondary | ICD-10-CM | POA: Insufficient documentation

## 2021-04-18 DIAGNOSIS — D1809 Hemangioma of other sites: Secondary | ICD-10-CM | POA: Insufficient documentation

## 2021-04-23 ENCOUNTER — Ambulatory Visit: Payer: Medicaid Other | Admitting: Family Medicine

## 2021-05-12 ENCOUNTER — Telehealth: Admitting: Nurse Practitioner

## 2021-05-12 DIAGNOSIS — R112 Nausea with vomiting, unspecified: Secondary | ICD-10-CM | POA: Diagnosis not present

## 2021-05-12 MED ORDER — ONDANSETRON 4 MG PO TBDP
4.0000 mg | ORAL_TABLET | Freq: Three times a day (TID) | ORAL | 0 refills | Status: AC | PRN
Start: 2021-05-12 — End: 2021-05-19

## 2021-05-12 NOTE — Progress Notes (Signed)
  E-Visit for Nausea and Vomiting & Diarrhea   We are sorry that you are not feeling well. Here is how we plan to help!  Based on what you have shared with me it looks like you have a Virus that is irritating your GI tract.  Vomiting is the forceful emptying of a portion of the stomach's content through the mouth.  Although nausea and vomiting can make you feel miserable, it's important to remember that these are not diseases, but rather symptoms of an underlying illness.  When we treat short term symptoms, we always caution that any symptoms that persist should be fully evaluated in a medical office.  For your symptoms you may take Imodium 2 mg tablets that are over the counter at your local pharmacy. Take two tablet now and then one after each loose stool up to 6 a day.  Antibiotics are not needed for most people with diarrhea.  I have prescribed a medication that will help alleviate your symptoms and allow you to stay hydrated:  Zofran 4 mg 1 tablet every 8 hours as needed for nausea and vomiting  HOME CARE: Drink clear liquids.  This is very important! Dehydration (the lack of fluid) can lead to a serious complication.  Start off with 1 tablespoon every 5 minutes for 8 hours. You may begin eating bland foods after 8 hours without vomiting.  Start with saltine crackers, white bread, rice, mashed potatoes, applesauce. After 48 hours on a bland diet, you may resume a normal diet. Try to go to sleep.  Sleep often empties the stomach and relieves the need to vomit.  GET HELP RIGHT AWAY IF:  Your symptoms do not improve or worsen within 2 days after treatment. You have a fever for over 3 days. You cannot keep down fluids after trying the medication.  MAKE SURE YOU:  Understand these instructions. Will watch your condition. Will get help right away if you are not doing well or get worse.  Meds ordered this encounter  Medications   ondansetron (ZOFRAN ODT) 4 MG disintegrating tablet     Sig: Take 1 tablet (4 mg total) by mouth every 8 (eight) hours as needed for up to 7 days for nausea or vomiting.    Dispense:  21 tablet    Refill:  0     Thank you for choosing an e-visit.  I spent approximately 7 minutes reviewing this chart and coordinating care for this patient.   Your e-visit answers were reviewed by a board certified advanced clinical practitioner to complete your personal care plan. Depending upon the condition, your plan could have included both over the counter or prescription medications.  Please review your pharmacy choice. Make sure the pharmacy is open so you can pick up prescription now. If there is a problem, you may contact your provider through [employer] and have the prescription routed to another pharmacy.  Your safety is important to [place]. If you have drug allergies check your prescription carefully.   For the next 24 hours you can use MyChart to ask questions about today's visit, request a non-urgent call back, or ask for a work or school excuse. You will get an email in the next two days asking about your experience. I hope that your e-visit has been valuable and will speed your recovery.

## 2021-05-18 ENCOUNTER — Telehealth: Admitting: Nurse Practitioner

## 2021-05-18 DIAGNOSIS — B9689 Other specified bacterial agents as the cause of diseases classified elsewhere: Secondary | ICD-10-CM

## 2021-05-18 DIAGNOSIS — N760 Acute vaginitis: Secondary | ICD-10-CM

## 2021-05-18 MED ORDER — METRONIDAZOLE 500 MG PO TABS: 500.0000 mg | ORAL_TABLET | Freq: Two times a day (BID) | ORAL | 0 refills | Status: DC

## 2021-05-18 NOTE — Progress Notes (Signed)
E-Visit for Vaginal Symptoms ? ?We are sorry that you are not feeling well. Here is how we plan to help! ?Based on what you shared with me it looks like you: May have a vaginosis due to bacteria ? ?Vaginosis is an inflammation of the vagina that can result in discharge, itching and pain. The cause is usually a change in the normal balance of vaginal bacteria or an infection. Vaginosis can also result from reduced estrogen levels after menopause. ? ?The most common causes of vaginosis are: ? ? Bacterial vaginosis which results from an overgrowth of one on several organisms that are normally present in your vagina. ? ? Yeast infections which are caused by a naturally occurring fungus called candida. ? ? Vaginal atrophy (atrophic vaginosis) which results from the thinning of the vagina from reduced estrogen levels after menopause. ? ? Trichomoniasis which is caused by a parasite and is commonly transmitted by sexual intercourse. ? ?Factors that increase your risk of developing vaginosis include: ?Medications, such as antibiotics and steroids ?Uncontrolled diabetes ?Use of hygiene products such as bubble bath, vaginal spray or vaginal deodorant ?Douching ?Wearing damp or tight-fitting clothing ?Using an intrauterine device (IUD) for birth control ?Hormonal changes, such as those associated with pregnancy, birth control pills or menopause ?Sexual activity ?Having a sexually transmitted infection ? ?Your treatment plan is Metronidazole or Flagyl 500mg twice a day for 7 days.  I have electronically sent this prescription into the pharmacy that you have chosen. ? ?Be sure to take all of the medication as directed. Stop taking any medication if you develop a rash, tongue swelling or shortness of breath. Mothers who are breast feeding should consider pumping and discarding their breast milk while on these antibiotics. However, there is no consensus that infant exposure at these doses would be harmful.  ?Remember that  medication creams can weaken latex condoms. ?. ? ? ?HOME CARE: ? ?Good hygiene may prevent some types of vaginosis from recurring and may relieve some symptoms: ? ?Avoid baths, hot tubs and whirlpool spas. Rinse soap from your outer genital area after a shower, and dry the area well to prevent irritation. Don't use scented or harsh soaps, such as those with deodorant or antibacterial action. ?Avoid irritants. These include scented tampons and pads. ?Wipe from front to back after using the toilet. Doing so avoids spreading fecal bacteria to your vagina. ? ?Other things that may help prevent vaginosis include: ? ?Don't douche. Your vagina doesn't require cleansing other than normal bathing. Repetitive douching disrupts the normal organisms that reside in the vagina and can actually increase your risk of vaginal infection. Douching won't clear up a vaginal infection. ?Use a latex condom. Both female and female latex condoms may help you avoid infections spread by sexual contact. ?Wear cotton underwear. Also wear pantyhose with a cotton crotch. If you feel comfortable without it, skip wearing underwear to bed. Yeast thrives in moist environments ?Your symptoms should improve in the next day or two. ? ?GET HELP RIGHT AWAY IF: ? ?You have pain in your lower abdomen ( pelvic area or over your ovaries) ?You develop nausea or vomiting ?You develop a fever ?Your discharge changes or worsens ?You have persistent pain with intercourse ?You develop shortness of breath, a rapid pulse, or you faint. ? ?These symptoms could be signs of problems or infections that need to be evaluated by a medical provider now. ? ?MAKE SURE YOU  ? ?Understand these instructions. ?Will watch your condition. ?Will get help right   away if you are not doing well or get worse. ? ?Thank you for choosing an e-visit. ? ?Your e-visit answers were reviewed by a board certified advanced clinical practitioner to complete your personal care plan. Depending upon the  condition, your plan could have included both over the counter or prescription medications. ? ?Please review your pharmacy choice. Make sure the pharmacy is open so you can pick up prescription now. If there is a problem, you may contact your provider through MyChart messaging and have the prescription routed to another pharmacy.  Your safety is important to us. If you have drug allergies check your prescription carefully.  ? ?For the next 24 hours you can use MyChart to ask questions about today's visit, request a non-urgent call back, or ask for a work or school excuse. ?You will get an email in the next two days asking about your experience. I hope that your e-visit has been valuable and will speed your recovery. ? ?5-10 minutes spent reviewing and documenting in chart. ? ?

## 2021-05-24 IMAGING — US US ABDOMEN LIMITED
1 series · 14 of 25 positions shown · non-contrast
Comparison: None.

CLINICAL DATA: Right upper quadrant abdominal pain

EXAM:
ULTRASOUND ABDOMEN LIMITED RIGHT UPPER QUADRANT

[Series 1: us abdomen limited ruq (liver/gb) · 14 of 33 slices shown]
[im 1/33]
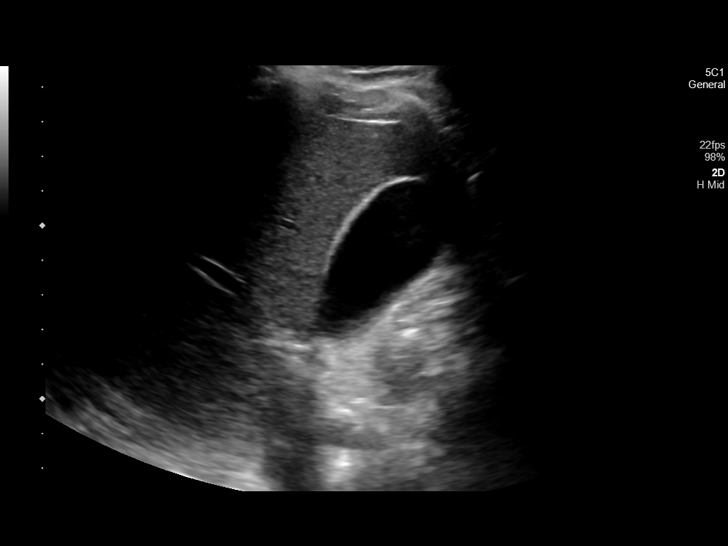
[im 3/33]
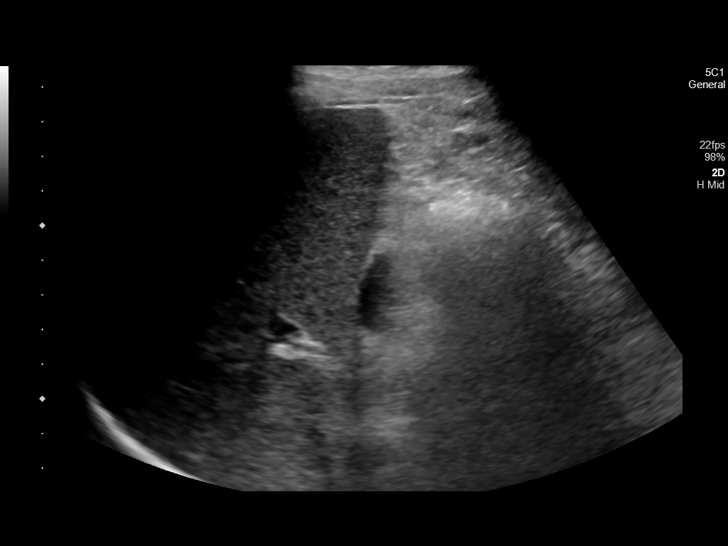
[im 6/33]
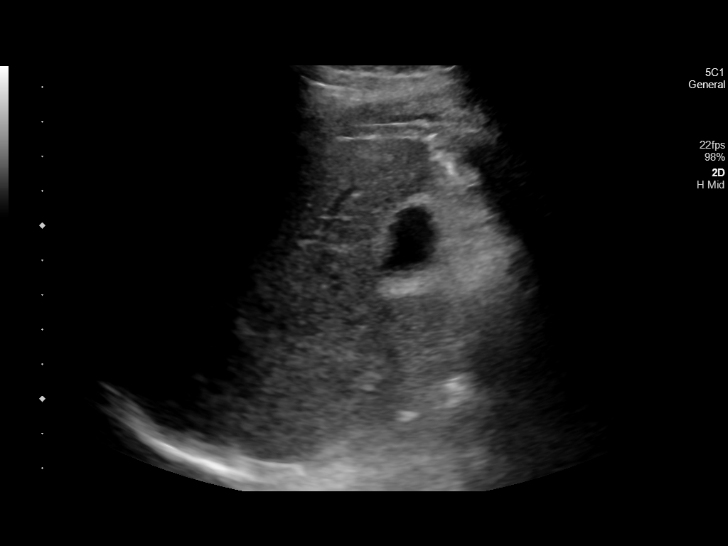
[im 9/33]
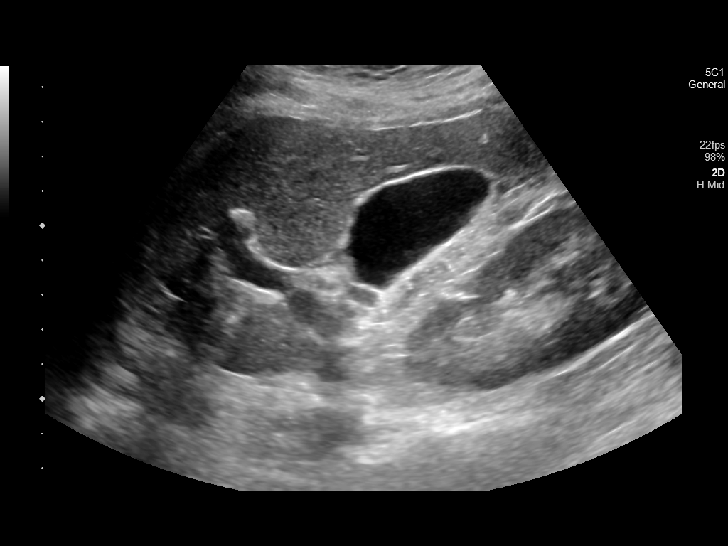
[im 11/33]
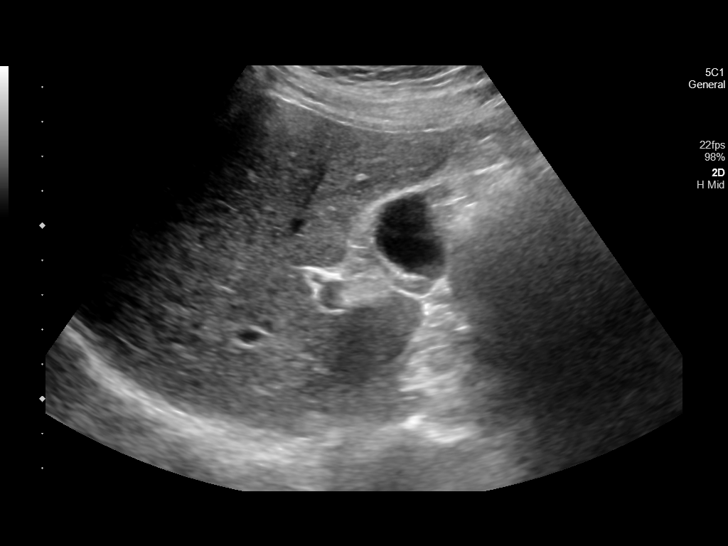
[im 13/33]
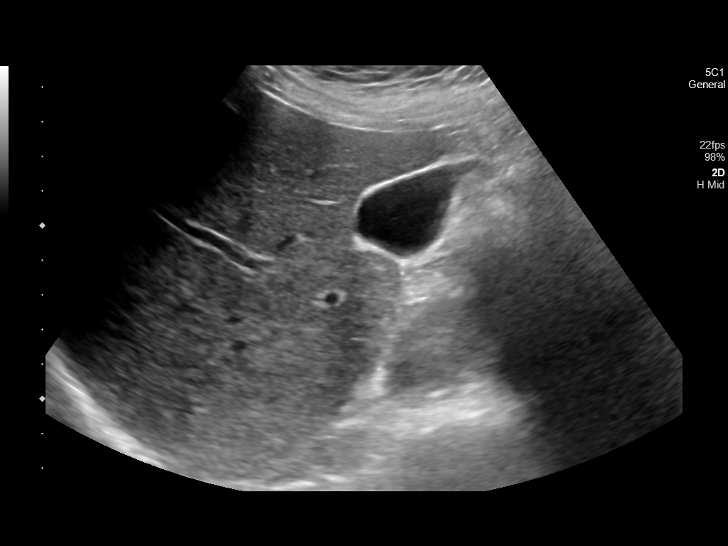
[im 15/33]
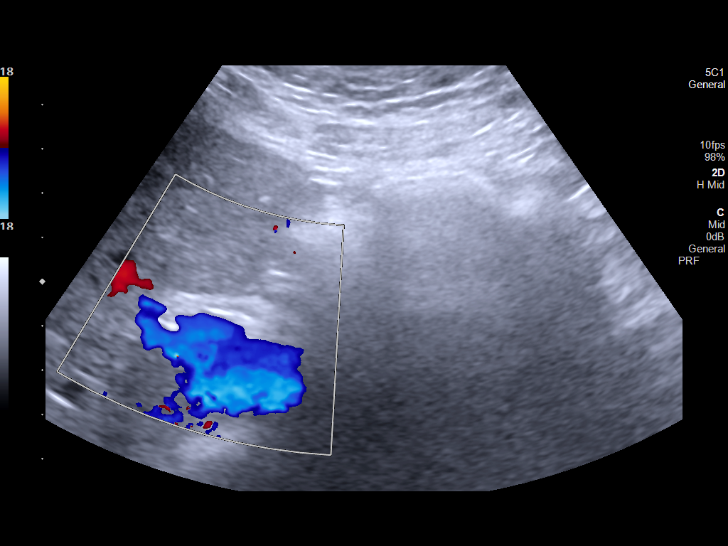
[im 18/33]
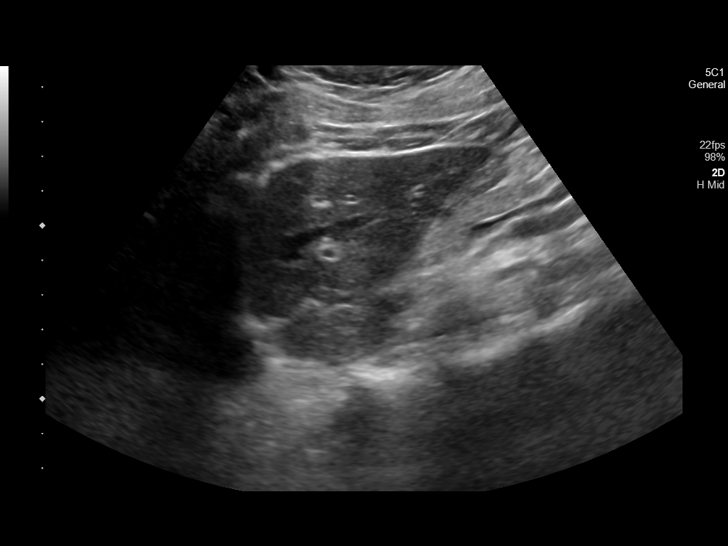
[im 21/33]
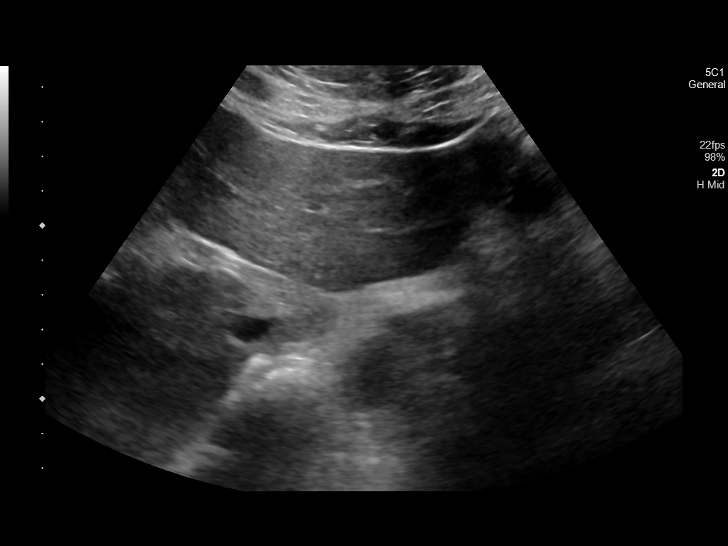
[im 22/33]
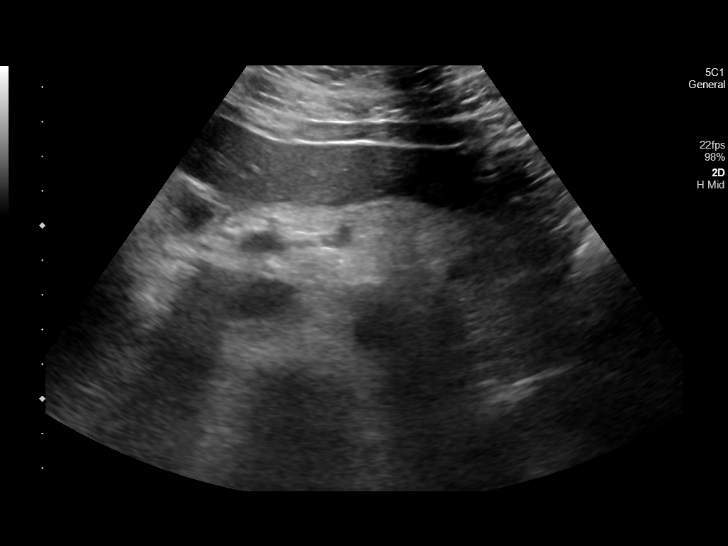
[im 25/33]
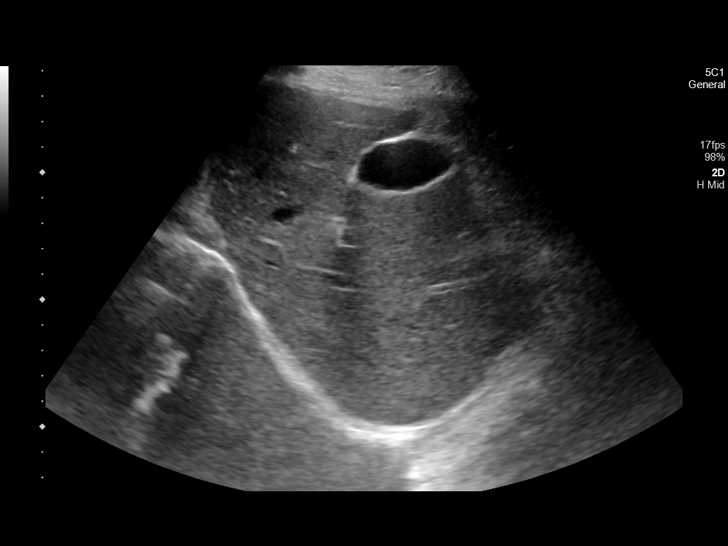
[im 27/33]
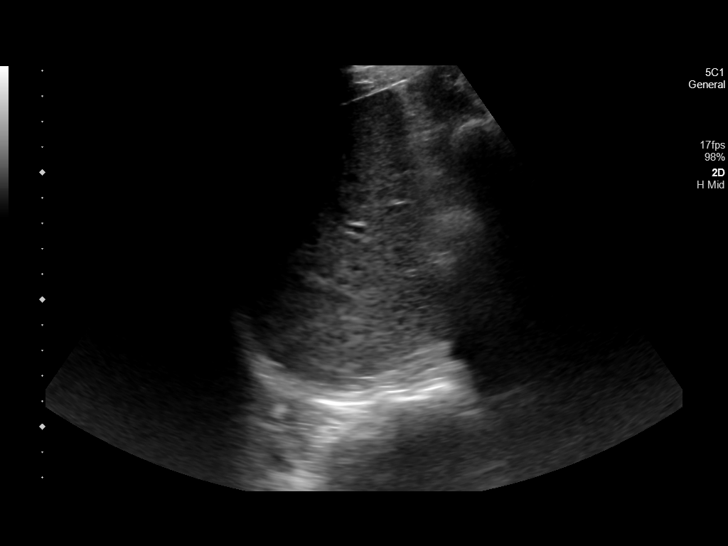
[im 30/33]
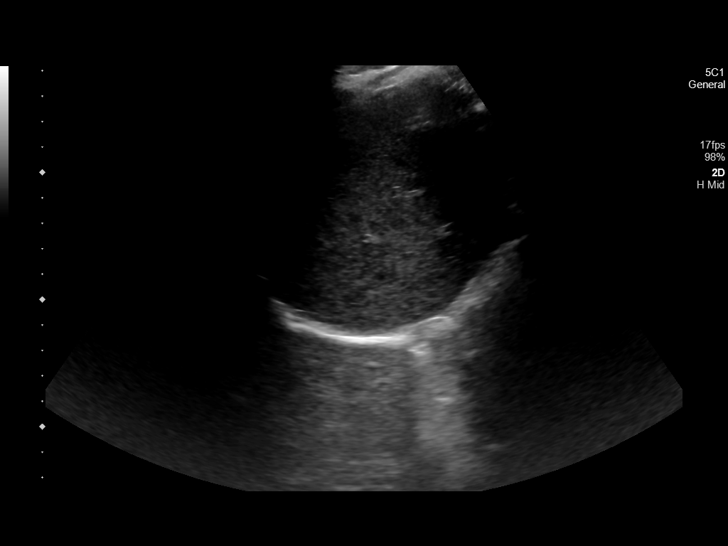
[im 33/33]
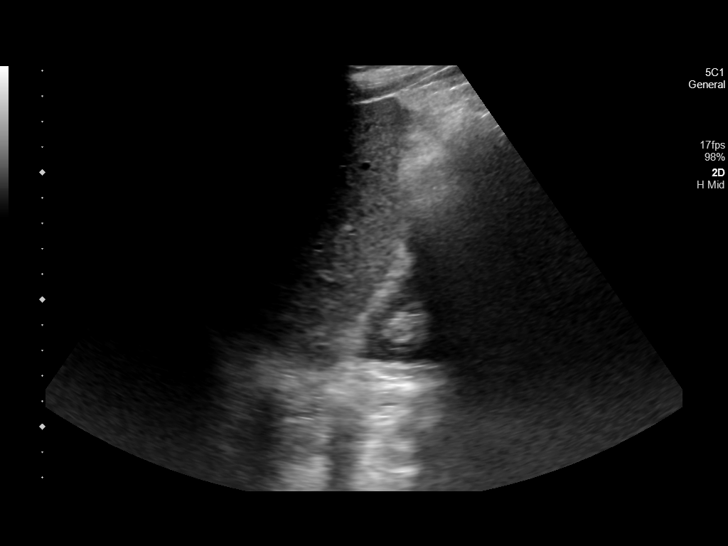

[14 of 25 positions shown; findings below may reference images not displayed]

FINDINGS: Gallbladder:

No gallstones or wall thickening visualized. No sonographic Murphy
sign noted by sonographer.

Common bile duct:

Diameter: 2-3 mm in proximal diameter

Liver:

No focal lesion identified. Within normal limits in parenchymal
echogenicity. Portal vein is patent on color Doppler imaging with
normal direction of blood flow towards the liver.

Other: The visualized portion the pancreas is unremarkable
IMPRESSION: Normal examination

## 2021-10-15 ENCOUNTER — Telehealth (INDEPENDENT_AMBULATORY_CARE_PROVIDER_SITE_OTHER): Admitting: Physician Assistant

## 2021-10-15 DIAGNOSIS — B9689 Other specified bacterial agents as the cause of diseases classified elsewhere: Secondary | ICD-10-CM | POA: Diagnosis not present

## 2021-10-15 DIAGNOSIS — J208 Acute bronchitis due to other specified organisms: Secondary | ICD-10-CM | POA: Diagnosis not present

## 2021-10-15 MED ORDER — BENZONATATE 100 MG PO CAPS: 100.0000 mg | ORAL_CAPSULE | Freq: Three times a day (TID) | ORAL | 0 refills | Status: DC | PRN

## 2021-10-15 MED ORDER — PREDNISONE 10 MG (21) PO TBPK: ORAL_TABLET | ORAL | 0 refills | Status: DC

## 2021-10-15 MED ORDER — AZITHROMYCIN 250 MG PO TABS: ORAL_TABLET | ORAL | 0 refills | Status: AC

## 2021-10-15 NOTE — Progress Notes (Signed)

## 2021-12-11 ENCOUNTER — Telehealth: Payer: Medicaid Other | Admitting: Physician Assistant

## 2021-12-11 DIAGNOSIS — J069 Acute upper respiratory infection, unspecified: Secondary | ICD-10-CM | POA: Diagnosis not present

## 2021-12-11 MED ORDER — ALBUTEROL SULFATE HFA 108 (90 BASE) MCG/ACT IN AERS: 2.0000 | INHALATION_SPRAY | Freq: Four times a day (QID) | RESPIRATORY_TRACT | 0 refills | Status: AC | PRN

## 2021-12-11 MED ORDER — BENZONATATE 100 MG PO CAPS: 100.0000 mg | ORAL_CAPSULE | Freq: Three times a day (TID) | ORAL | 0 refills | Status: DC | PRN

## 2021-12-11 NOTE — Progress Notes (Signed)

## 2021-12-11 NOTE — Progress Notes (Signed)
I have spent 5 minutes in review of e-visit questionnaire, review and updating patient chart, medical decision making and response to patient.   Ambriel Gorelick Cody Jareth Pardee, PA-C    

## 2021-12-17 ENCOUNTER — Telehealth: Admitting: Nurse Practitioner

## 2021-12-17 DIAGNOSIS — J40 Bronchitis, not specified as acute or chronic: Secondary | ICD-10-CM

## 2021-12-17 MED ORDER — DOXYCYCLINE HYCLATE 100 MG PO TABS: 100.0000 mg | ORAL_TABLET | Freq: Two times a day (BID) | ORAL | 0 refills | Status: AC

## 2021-12-17 MED ORDER — ALBUTEROL SULFATE HFA 108 (90 BASE) MCG/ACT IN AERS: 2.0000 | INHALATION_SPRAY | Freq: Four times a day (QID) | RESPIRATORY_TRACT | 0 refills | Status: AC | PRN

## 2021-12-17 MED ORDER — BENZONATATE 100 MG PO CAPS: 100.0000 mg | ORAL_CAPSULE | Freq: Three times a day (TID) | ORAL | 0 refills | Status: DC | PRN

## 2021-12-17 NOTE — Progress Notes (Signed)
We are sorry that you are not feeling well.  Here is how we plan to help!  Based on your presentation I believe you most likely have A cough due to bacteria.  When patients have a fever and a productive cough with a change in color or increased sputum production, we are concerned about bacterial bronchitis.  If left untreated it can progress to pneumonia.  If your symptoms do not improve with your treatment plan it is important that you contact your provider.   I have prescribed Doxycycline 100 mg twice a day for 7 days     In addition you may use A prescription cough medication called Tessalon Perles 100mg . You may take 1-2 capsules every 8 hours as needed for your cough.  We will also refill your Albuterol inhaler to assure you have enough to manage this episode.   Based on your recent visits and the questions you answered today we do feel that you should have an evaluation in person soon. If you do not have a primary care doctor you can visit [facility] website and search primary care to locate a doctor in your area. You can schedule a new appointment form the website as well. Recurrent bronchitis may require you to stay on a daily inhaler to prevent recurrent episodes, but this is something you will need to be evaluated in person for.    USE OF BRONCHODILATOR ("RESCUE") INHALERS: There is a risk from using your bronchodilator too frequently.  The risk is that over-reliance on a medication which only relaxes the muscles surrounding the breathing tubes can reduce the effectiveness of medications prescribed to reduce swelling and congestion of the tubes themselves.  Although you feel brief relief from the bronchodilator inhaler, your asthma may actually be worsening with the tubes becoming more swollen and filled with mucus.  This can delay other crucial treatments, such as oral steroid medications. If you need to use a bronchodilator inhaler daily, several times per day, you should discuss this with your  provider.  There are probably better treatments that could be used to keep your asthma under control.     HOME CARE Only take medications as instructed by your medical team. Complete the entire course of an antibiotic. Drink plenty of fluids and get plenty of rest. Avoid close contacts especially the very young and the elderly Cover your mouth if you cough or cough into your sleeve. Always remember to wash your hands A steam or ultrasonic humidifier can help congestion.   GET HELP RIGHT AWAY IF: You develop worsening fever. You become short of breath You cough up blood. Your symptoms persist after you have completed your treatment plan MAKE SURE YOU  Understand these instructions. Will watch your condition. Will get help right away if you are not doing well or get worse.    Thank you for choosing an e-visit.  Your e-visit answers were reviewed by a board certified advanced clinical practitioner to complete your personal care plan. Depending upon the condition, your plan could have included both over the counter or prescription medications.  Please review your pharmacy choice. Make sure the pharmacy is open so you can pick up prescription now. If there is a problem, you may contact your provider through [employer] and have the prescription routed to another pharmacy.  Your safety is important to [place]. If you have drug allergies check your prescription carefully.   For the next 24 hours you can use MyChart to ask questions about today's  visit, request a non-urgent call back, or ask for a work or school excuse. You will get an email in the next two days asking about your experience. I hope that your e-visit has been valuable and will speed your recovery.   I spent approximately 7 minutes reviewing the patient's history, current symptoms and coordinating their plan of care today.    Meds ordered this encounter  Medications   doxycycline (VIBRA-TABS) 100 MG tablet    Sig: Take 1  tablet (100 mg total) by mouth 2 (two) times daily for 7 days.    Dispense:  14 tablet    Refill:  0   albuterol (VENTOLIN HFA) 108 (90 Base) MCG/ACT inhaler    Sig: Inhale 2 puffs into the lungs every 6 (six) hours as needed for wheezing or shortness of breath.    Dispense:  8 g    Refill:  0   benzonatate (TESSALON) 100 MG capsule    Sig: Take 1 capsule (100 mg total) by mouth 3 (three) times daily as needed for cough.    Dispense:  30 capsule    Refill:  0

## 2021-12-22 ENCOUNTER — Telehealth (INDEPENDENT_AMBULATORY_CARE_PROVIDER_SITE_OTHER): Admitting: Nurse Practitioner

## 2021-12-22 DIAGNOSIS — F17200 Nicotine dependence, unspecified, uncomplicated: Secondary | ICD-10-CM

## 2021-12-22 MED ORDER — NICOTINE 21 MG/24HR TD PT24: 21.0000 mg | MEDICATED_PATCH | Freq: Every day | TRANSDERMAL | 0 refills | Status: DC

## 2021-12-22 NOTE — Progress Notes (Signed)
I have spent 5 minutes in review of e-visit questionnaire, review and updating patient chart, medical decision making and response to patient.  ° °Lalonnie Shaffer W Zawadi Aplin, NP ° °  °

## 2021-12-22 NOTE — Progress Notes (Signed)
E-Visit for Smoking Cessation  Congratulations for your interest in quitting smoking!  Quitting smoking is one of the most important things you can do to protect your health.  We are here to help you! Did you know that if you quit smoking 1 pack per day you could save up to $2550.00 per year?  Medications are not appropriate for everyone depending upon your situation and any health issues you may have. If we do prescribe medication, it will be for 1 month at time and you will be required to have a follow up E-Visit at 1 month to assess how you are doing and any side effects.  This could be up to a total of 3 months.    Support of your friends, family and work colleagues is very important. Please let them know that you are trying to stop smoking so that they understand your need for support in this goal.  Remove tobacco products from your environment  Set a quit date ideally within 2 weeks.  You should totally abstain from smoking after your quit date. A single puff could hurt your progress or cause you to relapse  If there are others in your household that smoke, ask them to try to quit or abstain from smoking in your presence  You may notice nicotine withdrawal symptoms such as increased appetite and weight gain, changes in mood, insomnia, irritability and/or anxiety. These symptoms peak in the first three days after smoking cessation and subside over the next 3-4 weeks.   I have prescribed a Nicotine patch for you.  Apply the patch to a non-hairy skin site and rotate the site daily. You will start using a 21 mg patch daily for six weeks.  You may use over the counter Nicotine gum. Chew one piece of gum every one to two hours while awake and when there is an urge to smoke. Use up to 24 pieces of gum per day for six weeks. Then gradually reduce use over a second six weeks. Then gradually reduce use over a second six weeks. Chew the gum slowly - using the chew and park method. Chew the gum  until the nicotine taste appears, then park the gum until the taste disappears, then chew again to release more nicotine. Chew the gum for 30 minutes.  A combination of behavioral and medication can improve the success of you quitting smoking. You may want to use the 1-800-QUIT-NOW free support line.  Also, the Department of Health and Human Services provides Smoke free apps for smartphones: https://www.white.net/  If you happen to break your plan and have a cigarette, keep taking your medications and continue to try to abstain.  Do not give up!  MAKE SURE YOU  Take any prescribed medications only as instructed.  If you miss a dose of medication, take the next dose when it is due and get back on schedule. DO NOT double up on medications.  Mark your calendar to do your Follow Up Smoking Cessation E-Visit in one month    Thank you for choosing an e-visit.  Your e-visit answers were reviewed by a board certified advanced clinical practitioner to complete your personal care plan. Depending upon the condition, your plan could have included both over the counter or prescription medications.  Please review your pharmacy choice. Make sure the pharmacy is open so you can pick up prescription now. If there is a problem, you may contact your provider through [facility] messaging and have the prescription routed to another  pharmacy.  Your safety is important to [place]. If you have drug allergies check your prescription carefully.   For the next 24 hours you can use MyChart to ask questions about today's visit, request a non-urgent call back, or ask for a work or school excuse. You will get an email in the next two days asking about your experience. I hope that your e-visit has been valuable and will speed your recovery.

## 2021-12-23 MED ORDER — NICOTINE 21 MG/24HR TD PT24: 21.0000 mg | MEDICATED_PATCH | Freq: Every day | TRANSDERMAL | 0 refills | Status: AC

## 2021-12-23 NOTE — Addendum Note (Signed)
Addended by: Mar Daring on: 12/23/2021 04:05 PM   Modules accepted: Orders

## 2022-01-09 ENCOUNTER — Other Ambulatory Visit: Payer: Self-pay | Admitting: Nurse Practitioner

## 2022-01-09 DIAGNOSIS — J40 Bronchitis, not specified as acute or chronic: Secondary | ICD-10-CM

## 2022-07-25 ENCOUNTER — Telehealth: Admitting: Physician Assistant

## 2022-07-25 DIAGNOSIS — R3989 Other symptoms and signs involving the genitourinary system: Secondary | ICD-10-CM | POA: Diagnosis not present

## 2022-07-25 MED ORDER — CEPHALEXIN 500 MG PO CAPS: 500.0000 mg | ORAL_CAPSULE | Freq: Two times a day (BID) | ORAL | 0 refills | Status: AC

## 2022-07-25 NOTE — Progress Notes (Signed)

## 2022-07-25 NOTE — Progress Notes (Signed)
I have spent 5 minutes in review of e-visit questionnaire, review and updating patient chart, medical decision making and response to patient.   Clementine Soulliere Cody Lealon Vanputten, PA-C    

## 2022-10-10 ENCOUNTER — Telehealth: Admitting: Emergency Medicine

## 2022-10-10 DIAGNOSIS — R0981 Nasal congestion: Secondary | ICD-10-CM

## 2022-10-10 NOTE — Progress Notes (Signed)
Unfortunately we are only licensed to provide are in [place] and [place].    You can try an appointment with [email or web address] ([place]) - they see patients across the country.   Sorry I can't help you via Evisit today.   Kind regards,  Willeen Cass, PhD, FNP-BC [place]

## 2023-02-26 ENCOUNTER — Inpatient Hospital Stay (HOSPITAL_COMMUNITY): Admission: RE | Admit: 2023-02-26 | Source: Ambulatory Visit

## 2023-02-26 DIAGNOSIS — Z1231 Encounter for screening mammogram for malignant neoplasm of breast: Secondary | ICD-10-CM

## 2023-03-07 ENCOUNTER — Ambulatory Visit: Admitting: Nurse Practitioner

## 2023-03-13 NOTE — Progress Notes (Deleted)
There were no vitals taken for this visit.   Subjective:    Patient ID: Amanda Harrington, female    DOB: [birth year], 37 y.o.   MRN: [id number]  HPI: Amanda Harrington is a 37 y.o. female  No chief complaint on file.  Establish care: her last physical was ***.  Medical history includes ***.  Family history includes ***.  Health maintenance ***.   Relevant past medical, surgical, family and social history reviewed and updated as indicated. Interim medical history since our last visit reviewed. Allergies and medications reviewed and updated.  Review of Systems  Constitutional: Negative for fever or weight change.  Respiratory: Negative for cough and shortness of breath.   Cardiovascular: Negative for chest pain or palpitations.  Gastrointestinal: Negative for abdominal pain, no bowel changes.  Musculoskeletal: Negative for gait problem or joint swelling.  Skin: Negative for rash.  Neurological: Negative for dizziness or headache.  No other specific complaints in a complete review of systems (except as listed in HPI above).      Objective:    There were no vitals taken for this visit.  Wt Readings from Last 3 Encounters:  12/01/20 137 lb (62.1 kg)  11/14/20 137 lb (62.1 kg)  11/11/20 137 lb (62.1 kg)    Physical Exam  Constitutional: Patient appears well-developed and well-nourished. Obese *** No distress.  HEENT: head atraumatic, normocephalic, pupils equal and reactive to light, ears ***, neck supple, throat within normal limits Cardiovascular: Normal rate, regular rhythm and normal heart sounds.  No murmur heard. No BLE edema. Pulmonary/Chest: Effort normal and breath sounds normal. No respiratory distress. Abdominal: Soft.  There is no tenderness. Psychiatric: Patient has a normal mood and affect. behavior is normal. Judgment and thought content normal.  Results for orders placed or performed during the hospital encounter of 12/01/20  Surgical pathology  Result Value Ref  Range   SURGICAL PATHOLOGY      SURGICAL PATHOLOGY CASE: [id number] PATIENT: Amanda Harrington Surgical Pathology Report     Specimen Submitted: A. Gallbladder  Clinical History: K80.50 biliary colic     DIAGNOSIS: A.  GALLBLADDER; CHOLECYSTECTOMY: - MINIMAL CHRONIC CHOLECYSTITIS. - NEGATIVE FOR DYSPLASIA AND MALIGNANCY. - BENIGN CYSTIC DUCT LYMPH NODE.  GROSS DESCRIPTION: A. Labeled: Gallbladder Received: Formalin Collection time: 8:35 AM on 12/01/2020 Placed into formalin time: 8:39 AM on 12/01/2020 Size of specimen: 6.2 x 3.7 x 3 cm Specimen integrity: Intact External surface: The serosa is pink-purple, smooth, and glistening with a roughened hepatic bed. Wall thickness: 0.2 cm Mucosa: The mucosa is red and velvety.  Cholesterolosis is absent. Cystic duct: The cystic duct is 1.1 x 0.6 x 0.5 cm.  The duct is patent and grossly unremarkable.  There is an adjacent lymph node candidate, 0.7 x 0.6 x 0.5 cm. Bile present: There is red viscous bile. Stones present: No distinct stones are gro ssly appreciated. Other findings: None grossly appreciated.  Block summary:  1 - cystic duct resection margin, en face and inked blue, with representative wall 2 - bisected lymph node candidate, submitted entirely   Final Diagnosis performed by Ronald Lobo, MD.   Electronically signed 12/04/2020 5:46:36PM The electronic signature indicates that the named Attending Pathologist has evaluated the specimen Technical component performed at [facility], [address] [place], [place] [address] Lab: [phone] Dir: Jolene Schimke, MD, MMM  Professional component performed at [facility], [facility], [address] [place], [place], [place] [address] Lab: [phone] Dir: Georgiann Cocker. Oneita Kras, MD  Assessment & Plan:   Problem List Items Addressed This Visit   None    Follow up plan: No follow-ups on file.

## 2023-03-14 ENCOUNTER — Ambulatory Visit: Admitting: Nurse Practitioner

## 2023-04-08 ENCOUNTER — Ambulatory Visit: Admitting: Nurse Practitioner

## 2023-04-15 ENCOUNTER — Ambulatory Visit: Admitting: Physician Assistant
